# Patient Record
Sex: Male | Born: 1990 | Race: White | Hispanic: No | Marital: Single | Smoking: Never smoker
Health system: Southern US, Community
[De-identification: ages and names within clinical notes are randomized; demographics above are authoritative.]

## PROBLEM LIST (undated history)

## (undated) DIAGNOSIS — R112 Nausea with vomiting, unspecified: Secondary | ICD-10-CM

## (undated) DIAGNOSIS — K219 Gastro-esophageal reflux disease without esophagitis: Secondary | ICD-10-CM

## (undated) DIAGNOSIS — F419 Anxiety disorder, unspecified: Secondary | ICD-10-CM

## (undated) DIAGNOSIS — F1290 Cannabis use, unspecified, uncomplicated: Secondary | ICD-10-CM

## (undated) DIAGNOSIS — F12988 Cannabis use, unspecified with other cannabis-induced disorder: Secondary | ICD-10-CM

## (undated) HISTORY — DX: Gastro-esophageal reflux disease without esophagitis: K21.9

## (undated) HISTORY — DX: Anxiety disorder, unspecified: F41.9

---
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
## 2009-07-18 ENCOUNTER — Emergency Department (HOSPITAL_COMMUNITY): Admission: EM | Admit: 2009-07-18 | Discharge: 2009-07-18 | Payer: Self-pay | Admitting: Emergency Medicine

## 2014-10-30 ENCOUNTER — Telehealth: Payer: Self-pay | Admitting: Gastroenterology

## 2014-10-30 ENCOUNTER — Ambulatory Visit (INDEPENDENT_AMBULATORY_CARE_PROVIDER_SITE_OTHER): Payer: BC Managed Care – PPO | Admitting: Gastroenterology

## 2014-10-30 ENCOUNTER — Encounter: Payer: Self-pay | Admitting: Gastroenterology

## 2014-10-30 ENCOUNTER — Other Ambulatory Visit (INDEPENDENT_AMBULATORY_CARE_PROVIDER_SITE_OTHER): Payer: BC Managed Care – PPO

## 2014-10-30 VITALS — BP 110/70 | HR 72 | Ht 74.0 in | Wt 196.0 lb

## 2014-10-30 DIAGNOSIS — R634 Abnormal weight loss: Secondary | ICD-10-CM

## 2014-10-30 DIAGNOSIS — R1110 Vomiting, unspecified: Secondary | ICD-10-CM

## 2014-10-30 DIAGNOSIS — R112 Nausea with vomiting, unspecified: Secondary | ICD-10-CM | POA: Insufficient documentation

## 2014-10-30 LAB — COMPREHENSIVE METABOLIC PANEL
ALT: 27 U/L (ref 0–53)
AST: 28 U/L (ref 0–37)
Albumin: 4.3 g/dL (ref 3.5–5.2)
Alkaline Phosphatase: 83 U/L (ref 39–117)
BUN: 12 mg/dL (ref 6–23)
CO2: 30 mEq/L (ref 19–32)
Calcium: 9.5 mg/dL (ref 8.4–10.5)
Chloride: 101 mEq/L (ref 96–112)
Creatinine, Ser: 1.1 mg/dL (ref 0.4–1.5)
GFR: 82.98 mL/min (ref >60.00)
Glucose, Bld: 92 mg/dL (ref 70–99)
Potassium: 4.5 mEq/L (ref 3.5–5.1)
Sodium: 138 mEq/L (ref 135–145)
Total Bilirubin: 0.8 mg/dL (ref 0.2–1.2)
Total Protein: 7.9 g/dL (ref 6.0–8.3)

## 2014-10-30 LAB — CBC WITH DIFFERENTIAL/PLATELET
BASOS PCT: 0.3 % (ref 0.0–3.0)
Basophils Absolute: 0 10*3/uL (ref 0.0–0.1)
Eosinophils Absolute: 0 10*3/uL (ref 0.0–0.7)
Eosinophils Relative: 0.5 % (ref 0.0–5.0)
HEMATOCRIT: 48.3 % (ref 39.0–52.0)
Hemoglobin: 16 g/dL (ref 13.0–17.0)
LYMPHS ABS: 1.8 10*3/uL (ref 0.7–4.0)
LYMPHS PCT: 23.1 % (ref 12.0–46.0)
MCHC: 33.1 g/dL (ref 30.0–36.0)
MCV: 91 fl (ref 78.0–100.0)
Monocytes Absolute: 0.9 10*3/uL (ref 0.1–1.0)
Monocytes Relative: 11.8 % (ref 3.0–12.0)
Neutro Abs: 5 10*3/uL (ref 1.4–7.7)
Neutrophils Relative %: 64.3 % (ref 43.0–77.0)
Platelets: 234 10*3/uL (ref 150.0–400.0)
RBC: 5.31 Mil/uL (ref 4.22–5.81)
RDW: 12.7 % (ref 11.5–15.5)
WBC: 7.7 10*3/uL (ref 4.0–10.5)

## 2014-10-30 LAB — LIPASE: Lipase: 26 U/L (ref 11.0–59.0)

## 2014-10-30 MED ORDER — PANTOPRAZOLE SODIUM 40 MG PO TBEC: 40.0000 mg | DELAYED_RELEASE_TABLET | Freq: Every day | ORAL | Status: DC

## 2014-10-30 NOTE — Telephone Encounter (Signed)
Patient's mother reports daily nausea and vomiting.  He is scheduled for today at 3:00 with Doug SouJessica Zehr, PA

## 2014-10-30 NOTE — Progress Notes (Signed)
     10/30/2014 Peter SessionsMark G Suarez [id number] Jul 10, 1991   HISTORY OF PRESENT ILLNESS:  This is a [age] year old male who presents to our office today as a new patient for complaints of vomiting and weight loss.  He says that a couple of years ago he went through a period where he was vomiting every day, but it was thought to be related to his stressful job.  He did get a new job and the symptoms subsided; was still having some vomiting maybe once a month, however.  Now, over the past month the vomiting has returned.  He says that every morning he is waking up vomiting.  Vomits liquid only.  He has much less stress at his current job.  Vomiting only occurs in the early morning and not throughout the day, so not related to eating.  He does not that he's had some increased heartburn/reflux recently as well.  Some diffuse upper abdominal pain/discomfort that feels like "knots" in his stomach". Appetite has been less.  Lost 8-10 pounds over the past month.  No bowel issues.  Has not had any evaluation or treatment at this point.     History reviewed. No pertinent past medical history. History reviewed. No pertinent past surgical history.  reports that he has never smoked. He does not have any smokeless tobacco history on file. He reports that he does not use illicit drugs. His alcohol history is not on file. family history includes High blood pressure in his maternal grandmother. No Known Allergies    Outpatient Encounter Prescriptions as of 10/30/2014  Medication Sig  . pantoprazole (PROTONIX) 40 MG tablet Take 1 tablet (40 mg total) by mouth daily.     REVIEW OF SYSTEMS  : All other systems reviewed and negative except where noted in the History of Present Illness.   PHYSICAL EXAM: BP 110/70 mmHg  Pulse 72  Ht 6\' 2"  (1.88 m)  Wt 196 lb (88.905 kg)  BMI 25.15 kg/m2 General: Well developed white male in no acute distress Head: Normocephalic and atraumatic Eyes:  Sclerae anicteric, conjunctiva  pink. Ears: Normal auditory acuity Lungs: Clear throughout to auscultation Heart: Regular rate and rhythm Abdomen: Soft, non-distended.  Normal bowel sounds.  Mild upper abdominal TTP without R/R/G. Musculoskeletal: Symmetrical with no gross deformities  Skin: No lesions on visible extremities Extremities: No edema  Neurological: Alert oriented x 4, grossly non-focal Psychological:  Alert and cooperative. Normal mood and affect  ASSESSMENT AND PLAN: -23 year old male with persistent daily vomiting and now weight loss of 8-10 pounds over the past month:  ? GERD related vs gallbladder issues, etc.  Will schedule for EGD with Dr. Leone PayorGessner.  The risks, benefits, and alternatives were discussed with the patient and he consents to proceed.  Will start pantoprazole 40 mg daily as well.  If EGD normal/negative and no improvement on PPI then could consider gallbladder evaluation.

## 2014-10-30 NOTE — Patient Instructions (Signed)
You have been scheduled for an endoscopy. Please follow written instructions given to you at your visit today. If you use inhalers (even only as needed), please bring them with you on the day of your procedure. Your physician has requested that you go to www.startemmi.com and enter the access code given to you at your visit today. This web site gives a general overview about your procedure. However, you should still follow specific instructions given to you by our office regarding your preparation for the procedure.  Your physician has requested that you go to the basement for the following lab work before leaving today: CBC CMP TSH Lipase   We have sent the following medications to your pharmacy for you to pick up at your convenience: Protonix

## 2014-10-31 ENCOUNTER — Encounter: Payer: Self-pay | Admitting: Internal Medicine

## 2014-10-31 ENCOUNTER — Ambulatory Visit (AMBULATORY_SURGERY_CENTER): Payer: BC Managed Care – PPO | Admitting: Internal Medicine

## 2014-10-31 VITALS — BP 110/72 | HR 61 | Temp 97.9°F | Resp 49 | Ht 74.0 in | Wt 196.0 lb

## 2014-10-31 DIAGNOSIS — K2990 Gastroduodenitis, unspecified, without bleeding: Secondary | ICD-10-CM

## 2014-10-31 DIAGNOSIS — R1110 Vomiting, unspecified: Secondary | ICD-10-CM

## 2014-10-31 DIAGNOSIS — K208 Other esophagitis: Secondary | ICD-10-CM

## 2014-10-31 DIAGNOSIS — R1115 Cyclical vomiting syndrome unrelated to migraine: Secondary | ICD-10-CM

## 2014-10-31 DIAGNOSIS — K2970 Gastritis, unspecified, without bleeding: Secondary | ICD-10-CM

## 2014-10-31 DIAGNOSIS — K2950 Unspecified chronic gastritis without bleeding: Secondary | ICD-10-CM

## 2014-10-31 DIAGNOSIS — K209 Esophagitis, unspecified without bleeding: Secondary | ICD-10-CM

## 2014-10-31 HISTORY — PX: ESOPHAGOGASTRODUODENOSCOPY: SHX1529

## 2014-10-31 LAB — TSH: TSH: 1.17 u[IU]/mL (ref 0.35–4.50)

## 2014-10-31 MED ORDER — OMEPRAZOLE-SODIUM BICARBONATE 40-1100 MG PO CAPS: 1.0000 | ORAL_CAPSULE | Freq: Every day | ORAL | Status: DC

## 2014-10-31 MED ORDER — SODIUM CHLORIDE 0.9 % IV SOLN: 500.0000 mL | INTRAVENOUS | Status: DC

## 2014-10-31 NOTE — Patient Instructions (Addendum)
There as esophagitis and gastritis - inflammation of esophagus and stomach present. I took biopsies to understand the cause. Do not know if these are causing the vomiting or a result.  I am prescribing Zegerid to take at bedtime - it blocks acid production and can help.  Will call with biopsy results and plans.  I appreciate the opportunity to care for you. Iva Booparl E. Zafirah Vanzee, MD, [facility]   Discharge instructions given with verbal understanding. Biopsies taken. Resume previous medications. YOU HAD AN ENDOSCOPIC PROCEDURE TODAY AT THE [facility]: Refer to the procedure report that was given to you for any specific questions about what was found during the examination.  If the procedure report does not answer your questions, please call your gastroenterologist to clarify.  If you requested that your care partner not be given the details of your procedure findings, then the procedure report has been included in a sealed envelope for you to review at your convenience later.  YOU SHOULD EXPECT: Some feelings of bloating in the abdomen. Passage of more gas than usual.  Walking can help get rid of the air that was put into your GI tract during the procedure and reduce the bloating. If you had a lower endoscopy (such as a colonoscopy or flexible sigmoidoscopy) you may notice spotting of blood in your stool or on the toilet paper. If you underwent a bowel prep for your procedure, then you may not have a normal bowel movement for a few days.  DIET: Your first meal following the procedure should be a light meal and then it is ok to progress to your normal diet.  A half-sandwich or bowl of soup is an example of a good first meal.  Heavy or fried foods are harder to digest and may make you feel nauseous or bloated.  Likewise meals heavy in dairy and vegetables can cause extra gas to form and this can also increase the bloating.  Drink plenty of fluids but you should avoid alcoholic beverages for  24 hours.  ACTIVITY: Your care partner should take you home directly after the procedure.  You should plan to take it easy, moving slowly for the rest of the day.  You can resume normal activity the day after the procedure however you should NOT DRIVE or use heavy machinery for 24 hours (because of the sedation medicines used during the test).    SYMPTOMS TO REPORT IMMEDIATELY: A gastroenterologist can be reached at any hour.  During normal business hours, 8:30 AM to 5:00 PM Monday through Friday, call [phone].  After hours and on weekends, please call the GI answering service at [phone] who will take a message and have the physician on call contact you.   Following upper endoscopy (EGD)  Vomiting of blood or coffee ground material  New chest pain or pain under the shoulder blades  Painful or persistently difficult swallowing  New shortness of breath  Fever of 100F or higher  Black, tarry-looking stools  FOLLOW UP: If any biopsies were taken you will be contacted by phone or by letter within the next 1-3 weeks.  Call your gastroenterologist if you have not heard about the biopsies in 3 weeks.  Our staff will call the home number listed on your records the next business day following your procedure to check on you and address any questions or concerns that you may have at that time regarding the information given to you following your procedure. This is a Research officer, political partycourtesy  call and so if there is no answer at the home number and we have not heard from you through the emergency physician on call, we will assume that you have returned to your regular daily activities without incident.  SIGNATURES/CONFIDENTIALITY: You and/or your care partner have signed paperwork which will be entered into your electronic medical record.  These signatures attest to the fact that that the information above on your After Visit Summary has been reviewed and is understood.  Full responsibility of the  confidentiality of this discharge information lies with you and/or your care-partner.

## 2014-10-31 NOTE — Op Note (Signed)
[facility] [address] [place], [id number]   ENDOSCOPY PROCEDURE REPORT  PATIENT: Peter Suarez, Peter Suarez  MR#: [id number] BIRTHDATE: 04-May-1991 , 23  yrs. old GENDER: male ENDOSCOPIST: Iva Booparl E Gessner, MD, [facility] PROCEDURE DATE:  10/31/2014 PROCEDURE:  EGD w/ biopsy ASA CLASS:     Class I INDICATIONS:  vomiting. MEDICATIONS: Propofol 280 mg IV and Monitored anesthesia care TOPICAL ANESTHETIC: none  DESCRIPTION OF PROCEDURE: After the risks benefits and alternatives of the procedure were thoroughly explained, informed consent was obtained.  The LB [id number] endoscope was introduced through the mouth and advanced to the second portion of the duodenum , Without limitations.  The instrument was slowly withdrawn as the mucosa was fully examined.    1) several superficial short erosions in distal esophagus - biopsies taken of erosive esophagitis 2) Mottled and erythematous antral and body mucosa - biopsies taken - looks like gastritis 3) Otherwise normal exam.  Retroflexed views revealed as previously described.     The scope was then withdrawn from the patient and the procedure completed.  COMPLICATIONS: There were no immediate complications.  ENDOSCOPIC IMPRESSION: 1) several superficial short erosions in distal esophagus - biopsies taken of erosive esophagitis 2) Mottled and erythematous antral and body mucosa - biopsies taken - looks like gastritis 3) Otherwise normal exam  RECOMMENDATIONS: 1) Zegerid 40 mg qhs 2) Call with biopsy results    eSigned:  Iva Booparl E Gessner, MD, [facility] 10/31/2014 8:24 AM    CC: The Patient

## 2014-10-31 NOTE — Progress Notes (Signed)
Report to PACU, RN, vss, BBS= Clear.  

## 2014-11-01 ENCOUNTER — Telehealth: Payer: Self-pay | Admitting: *Deleted

## 2014-11-01 NOTE — Telephone Encounter (Signed)
No answer, left message to call if questions or concerns. 

## 2014-11-04 NOTE — Progress Notes (Signed)
Agree with Ms. Zehr's management.  Icel Castles E. Townes Fuhs, MD, FACG  

## 2014-11-09 NOTE — Progress Notes (Signed)
Quick Note:  Let him know biopsies show inflammation - think GERD may be problem stay on Zegerid (ask if helping) See me in Jan  [facility] no letter or recall  ______

## 2014-12-10 ENCOUNTER — Telehealth: Payer: Self-pay | Admitting: Internal Medicine

## 2014-12-10 DIAGNOSIS — R112 Nausea with vomiting, unspecified: Secondary | ICD-10-CM

## 2014-12-10 NOTE — Telephone Encounter (Signed)
Schedule abdominal US  Rx promethazine 25 mg po every 6 hrs as needed # 30 no refill  Double check that he does not have headaches, visual sxs

## 2014-12-10 NOTE — Telephone Encounter (Signed)
Patient is having daily vomiting again.  Started again last week.  He had to leave work today.  Per procedure note may need additional testing for gallbladder testing.  He does not want to see an APP and next opening with you is 01/12/15

## 2014-12-11 MED ORDER — PROMETHAZINE HCL 25 MG PO TABS: 25.0000 mg | ORAL_TABLET | Freq: Four times a day (QID) | ORAL | Status: DC | PRN

## 2014-12-11 NOTE — Telephone Encounter (Signed)
Patient is scheduled for 12/13/14 9:00.  Patient to be NPO after midnight  He is notified to pick up rx for phenergan  No daily headaches or visual problems

## 2014-12-13 ENCOUNTER — Other Ambulatory Visit: Payer: Self-pay

## 2014-12-13 ENCOUNTER — Ambulatory Visit (HOSPITAL_COMMUNITY)
Admission: RE | Admit: 2014-12-13 | Discharge: 2014-12-13 | Disposition: A | Discharge status: Home / Self Care | Payer: BC Managed Care – PPO | Source: Ambulatory Visit | Attending: Internal Medicine | Admitting: Internal Medicine

## 2014-12-13 ENCOUNTER — Emergency Department (HOSPITAL_COMMUNITY)
Admission: EM | Admit: 2014-12-13 | Discharge: 2014-12-13 | Disposition: A | Discharge status: Home / Self Care | Payer: BC Managed Care – PPO | Attending: Emergency Medicine | Admitting: Emergency Medicine

## 2014-12-13 ENCOUNTER — Encounter (HOSPITAL_COMMUNITY): Payer: Self-pay

## 2014-12-13 DIAGNOSIS — F419 Anxiety disorder, unspecified: Secondary | ICD-10-CM

## 2014-12-13 DIAGNOSIS — F439 Reaction to severe stress, unspecified: Secondary | ICD-10-CM | POA: Insufficient documentation

## 2014-12-13 DIAGNOSIS — R112 Nausea with vomiting, unspecified: Secondary | ICD-10-CM | POA: Diagnosis not present

## 2014-12-13 DIAGNOSIS — K219 Gastro-esophageal reflux disease without esophagitis: Secondary | ICD-10-CM | POA: Insufficient documentation

## 2014-12-13 DIAGNOSIS — K209 Esophagitis, unspecified without bleeding: Secondary | ICD-10-CM

## 2014-12-13 DIAGNOSIS — Z79899 Other long term (current) drug therapy: Secondary | ICD-10-CM | POA: Insufficient documentation

## 2014-12-13 DIAGNOSIS — Z8659 Personal history of other mental and behavioral disorders: Secondary | ICD-10-CM | POA: Diagnosis not present

## 2014-12-13 DIAGNOSIS — R42 Dizziness and giddiness: Secondary | ICD-10-CM | POA: Diagnosis present

## 2014-12-13 LAB — CBC WITH DIFFERENTIAL/PLATELET
BASOS PCT: 0 % (ref 0–1)
Basophils Absolute: 0 10*3/uL (ref 0.0–0.1)
EOS PCT: 0 % (ref 0–5)
Eosinophils Absolute: 0 10*3/uL (ref 0.0–0.7)
HEMATOCRIT: 48.6 % (ref 39.0–52.0)
Hemoglobin: 17 g/dL (ref 13.0–17.0)
Lymphocytes Relative: 10 % — ABNORMAL LOW (ref 12–46)
Lymphs Abs: 1 10*3/uL (ref 0.7–4.0)
MCH: 30.5 pg (ref 26.0–34.0)
MCHC: 35 g/dL (ref 30.0–36.0)
MCV: 87.3 fL (ref 78.0–100.0)
MONO ABS: 0.9 10*3/uL (ref 0.1–1.0)
Monocytes Relative: 8 % (ref 3–12)
NEUTROS ABS: 8.6 10*3/uL — AB (ref 1.7–7.7)
Neutrophils Relative %: 82 % — ABNORMAL HIGH (ref 43–77)
Platelets: 247 10*3/uL (ref 150–400)
RBC: 5.57 MIL/uL (ref 4.22–5.81)
RDW: 12 % (ref 11.5–15.5)
WBC: 10.5 10*3/uL (ref 4.0–10.5)

## 2014-12-13 LAB — URINALYSIS, ROUTINE W REFLEX MICROSCOPIC
Glucose, UA: NEGATIVE mg/dL
Hgb urine dipstick: NEGATIVE
Leukocytes, UA: NEGATIVE
NITRITE: NEGATIVE
Protein, ur: 30 mg/dL — AB
Specific Gravity, Urine: 1.031 — ABNORMAL HIGH (ref 1.005–1.030)
UROBILINOGEN UA: 1 mg/dL (ref 0.0–1.0)
pH: 8.5 — ABNORMAL HIGH (ref 5.0–8.0)

## 2014-12-13 LAB — COMPREHENSIVE METABOLIC PANEL
ALT: 19 U/L (ref 0–53)
AST: 19 U/L (ref 0–37)
Albumin: 5.1 g/dL (ref 3.5–5.2)
Alkaline Phosphatase: 86 U/L (ref 39–117)
Anion gap: 19 — ABNORMAL HIGH (ref 5–15)
BUN: 15 mg/dL (ref 6–23)
CO2: 22 meq/L (ref 19–32)
Calcium: 10.4 mg/dL (ref 8.4–10.5)
Chloride: 98 mEq/L (ref 96–112)
Creatinine, Ser: 1.11 mg/dL (ref 0.50–1.35)
GFR calc Af Amer: >90 mL/min (ref >90)
Glucose, Bld: 106 mg/dL — ABNORMAL HIGH (ref 70–99)
POTASSIUM: 3.6 meq/L — AB (ref 3.7–5.3)
SODIUM: 139 meq/L (ref 137–147)
Total Bilirubin: 0.9 mg/dL (ref 0.3–1.2)
Total Protein: 8.2 g/dL (ref 6.0–8.3)

## 2014-12-13 LAB — URINE MICROSCOPIC-ADD ON

## 2014-12-13 LAB — LIPASE, BLOOD: Lipase: 43 U/L (ref 11–59)

## 2014-12-13 MED ORDER — SODIUM CHLORIDE 0.9 % IV BOLUS (SEPSIS)
1000.0000 mL | Freq: Once | INTRAVENOUS | Status: AC
  Administered 2014-12-13: 1000 mL via INTRAVENOUS

## 2014-12-13 MED ORDER — OMEPRAZOLE-SODIUM BICARBONATE 40-1100 MG PO CAPS: 1.0000 | ORAL_CAPSULE | Freq: Two times a day (BID) | ORAL | Status: DC

## 2014-12-13 MED ORDER — PROMETHAZINE HCL 25 MG/ML IJ SOLN
25.0000 mg | Freq: Once | INTRAMUSCULAR | Status: AC
  Administered 2014-12-13: 25 mg via INTRAVENOUS
  Filled 2014-12-13: qty 1

## 2014-12-13 MED ORDER — ONDANSETRON 8 MG PO TBDP: 8.0000 mg | ORAL_TABLET | Freq: Three times a day (TID) | ORAL | Status: DC | PRN

## 2014-12-13 NOTE — ED Provider Notes (Signed)
CSN: [id number]     Arrival date & time 12/13/14  0932 History   First MD Initiated Contact with Patient 12/13/14 [phone]     Chief Complaint  Patient presents with  . Dizziness  . Emesis  . Abdominal Pain    HPI Mr. Peter Suarez is a 23 year old male who presents today with nausea and non-bloody vomiting x 2 days. He sees Dr. Leone PayorGessner and has had this problem before. He was started on some medication he cannot remember and feels it is related to stress at work. This week, he was given Phenergan which has helped his symptoms. He has been NPO since midnight for an abdominal US study done. He denies eating anything different, diarrhea, fever, sick contacts.    Past Medical History  Diagnosis Date  . Anxiety   . GERD (gastroesophageal reflux disease)    History reviewed. No pertinent past surgical history. Family History  Problem Relation Age of Onset  . High blood pressure Maternal Grandmother    History  Substance Use Topics  . Smoking status: Never Smoker   . Smokeless tobacco: Not on file  . Alcohol Use: Not on file     Comment: Rarely    Review of Systems  Constitutional: Negative for fever.  Gastrointestinal: Positive for nausea and vomiting. Negative for abdominal pain and diarrhea.  Genitourinary: Negative for dysuria.      Allergies  Review of patient's allergies indicates no known allergies.  Home Medications   Prior to Admission medications   Medication Sig Start Date End Date Taking? Authorizing Provider  pantoprazole (PROTONIX) 40 MG tablet Take 1 tablet (40 mg total) by mouth daily. 10/30/14  Yes Jessica D. Zehr, PA-C  promethazine (PHENERGAN) 25 MG tablet Take 1 tablet (25 mg total) by mouth every 6 (six) hours as needed for nausea or vomiting. 12/11/14  Yes Iva Booparl E Gessner, MD  omeprazole-sodium bicarbonate (ZEGERID) 40-1100 MG per capsule Take 1 capsule by mouth 2 (two) times daily. 12/13/14   Heywood Ilesushil Cliffie Gingras, MD  ondansetron (ZOFRAN ODT) 8 MG disintegrating tablet  Take 1 tablet (8 mg total) by mouth every 8 (eight) hours as needed for nausea or vomiting. 12/13/14   Heywood Ilesushil Elex Mainwaring, MD   BP 119/72 mmHg  Pulse 63  Temp(Src) 99.4 F (37.4 C) (Oral)  Resp 12  SpO2 100% Physical Exam  Constitutional: He is oriented to person, place, and time. He appears well-developed and well-nourished. He appears distressed.  HENT:  Head: Normocephalic and atraumatic.  Mouth/Throat: No oropharyngeal exudate.  Dry mucous membranes.  Eyes: Conjunctivae are normal. Pupils are equal, round, and reactive to light.  Cardiovascular: Normal rate and regular rhythm.  Exam reveals no gallop and no friction rub.   No murmur heard. Pulmonary/Chest: Effort normal and breath sounds normal. No respiratory distress. He has no wheezes. He has no rales.  Abdominal: Soft. He exhibits no distension. There is no tenderness. There is no rebound.  Hypoactive bowel sounds. No tenderness to palpation though reports some discomfort.  Neurological: He is alert and oriented to person, place, and time. No cranial nerve deficit.  Skin: He is not diaphoretic.    ED Course  Procedures (including critical care time) Labs Review Labs Reviewed  CBC WITH DIFFERENTIAL - Abnormal; Notable for the following:    Neutrophils Relative % 82 (*)    Neutro Abs 8.6 (*)    Lymphocytes Relative 10 (*)    All other components within normal limits  URINALYSIS, ROUTINE W REFLEX MICROSCOPIC - Abnormal;  Notable for the following:    Color, Urine AMBER (*)    APPearance TURBID (*)    Specific Gravity, Urine 1.031 (*)    pH 8.5 (*)    Bilirubin Urine SMALL (*)    Ketones, ur >80 (*)    Protein, ur 30 (*)    All other components within normal limits  COMPREHENSIVE METABOLIC PANEL - Abnormal; Notable for the following:    Potassium 3.6 (*)    Glucose, Bld 106 (*)    Anion gap 19 (*)    All other components within normal limits  URINE MICROSCOPIC-ADD ON - Abnormal; Notable for the following:    Bacteria, UA  MANY (*)    All other components within normal limits  LIPASE, BLOOD    Imaging Review Koreas Abdomen Complete  12/13/2014   CLINICAL DATA:  Intractable vomiting with nausea.  EXAM: ULTRASOUND ABDOMEN COMPLETE  COMPARISON:  None.  FINDINGS: Gallbladder: No gallstones or wall thickening visualized. No sonographic Murphy sign noted.  Common bile duct: Diameter: 3 mm  Liver: No focal lesion identified. Within normal limits in parenchymal echogenicity.  IVC: No abnormality visualized.  Pancreas: Visualized portion unremarkable.  Spleen: Size and appearance within normal limits.  Right Kidney: Length: 9.9 cm. Echogenicity within normal limits. No mass or hydronephrosis visualized.  Left Kidney: Length: 9.9 cm. Echogenicity within normal limits. No mass or hydronephrosis visualized.  Abdominal aorta: No aneurysm visualized.  Other findings: None.  IMPRESSION: Negative. No evidence of gallstones or other sonographic abnormality.   Electronically Signed   By: Myles RosenthalJohn  Stahl M.D.   On: 12/13/2014 10:40     EKG Interpretation None      MDM   Final diagnoses:  Non-intractable vomiting with nausea, vomiting of unspecified type    Symptoms concerning for hepatobiliary disease though reassuring that he is without. Ab US pending.  11:00AM: Ab US is unremarkable. Labs remarkable for elevated anion gap, likely 2/2 persistent vomiting. LFTs otherwise normal. Plan to treat symptomatically.  139PM: UA with ketones 80, turbid. Plan to give more IV fluids for total 3L NS. Consulted GI who recommended increase PPI dosing to BID. Will also give Zofran instead of Phenergan given that the latter is more sedating for him.  257PM: Symptomatically improved and stable for discharge. Prescribed Zofran & omeprazole.   Heywood Ilesushil Dung Salinger, MD 12/13/14 1458  Juliet RudeNathan R. Rubin PayorPickering, MD 12/13/14 [phone]

## 2014-12-13 NOTE — ED Notes (Addendum)
Pt c/o sudden dizziness and tingling in extremities starting this morning, chronic generalized abdominal pain, and chronic emesis.  Pain score 3/10.  Sts emesis has increased x 1 week.  Pt reports having multiple GI issues and is followed by a GI MD.  Hx of GERD.  [facility] he began having symptoms.  Pt has had no medication, food, or drinks, since midnight.

## 2014-12-13 NOTE — Progress Notes (Signed)
Quick Note:  US is negative  I see he was in ED today also and told them he thinks stress is causing the nausea and vomiting That seems likely given results of labs and studies to date. I would not do more GI w/u but suggest he see a PCP - [facility] and be [facility] and they mnay be able to start something to treat stress and anxiety that may be cause. ______

## 2014-12-13 NOTE — ED Notes (Signed)
MD at bedside. EDP PICKING PRESENT

## 2015-01-09 ENCOUNTER — Ambulatory Visit (INDEPENDENT_AMBULATORY_CARE_PROVIDER_SITE_OTHER): Payer: BLUE CROSS/BLUE SHIELD | Admitting: Internal Medicine

## 2015-01-09 ENCOUNTER — Telehealth: Payer: Self-pay | Admitting: Internal Medicine

## 2015-01-09 ENCOUNTER — Telehealth: Payer: Self-pay | Admitting: *Deleted

## 2015-01-09 ENCOUNTER — Encounter (HOSPITAL_COMMUNITY): Payer: Self-pay | Admitting: Emergency Medicine

## 2015-01-09 ENCOUNTER — Emergency Department (HOSPITAL_COMMUNITY)
Admission: EM | Admit: 2015-01-09 | Discharge: 2015-01-09 | Disposition: A | Discharge status: Home / Self Care | Payer: BLUE CROSS/BLUE SHIELD | Attending: Emergency Medicine | Admitting: Emergency Medicine

## 2015-01-09 ENCOUNTER — Encounter: Payer: Self-pay | Admitting: Internal Medicine

## 2015-01-09 VITALS — BP 118/62 | HR 64 | Ht 74.0 in | Wt 175.0 lb

## 2015-01-09 DIAGNOSIS — Z79899 Other long term (current) drug therapy: Secondary | ICD-10-CM | POA: Insufficient documentation

## 2015-01-09 DIAGNOSIS — K219 Gastro-esophageal reflux disease without esophagitis: Secondary | ICD-10-CM | POA: Insufficient documentation

## 2015-01-09 DIAGNOSIS — R112 Nausea with vomiting, unspecified: Secondary | ICD-10-CM | POA: Diagnosis not present

## 2015-01-09 DIAGNOSIS — Z8659 Personal history of other mental and behavioral disorders: Secondary | ICD-10-CM | POA: Insufficient documentation

## 2015-01-09 DIAGNOSIS — R1110 Vomiting, unspecified: Secondary | ICD-10-CM

## 2015-01-09 HISTORY — DX: Nausea with vomiting, unspecified: R11.2

## 2015-01-09 LAB — COMPREHENSIVE METABOLIC PANEL
ALK PHOS: 77 U/L (ref 39–117)
ALT: 27 U/L (ref 0–53)
ANION GAP: 14 (ref 5–15)
AST: 25 U/L (ref 0–37)
Albumin: 5.5 g/dL — ABNORMAL HIGH (ref 3.5–5.2)
BUN: 20 mg/dL (ref 6–23)
CALCIUM: 10.1 mg/dL (ref 8.4–10.5)
CO2: 21 mmol/L (ref 19–32)
CREATININE: 1.09 mg/dL (ref 0.50–1.35)
Chloride: 104 mEq/L (ref 96–112)
GFR calc non Af Amer: >90 mL/min (ref >90)
Glucose, Bld: 143 mg/dL — ABNORMAL HIGH (ref 70–99)
POTASSIUM: 3.5 mmol/L (ref 3.5–5.1)
Sodium: 139 mmol/L (ref 135–145)
TOTAL PROTEIN: 8.5 g/dL — AB (ref 6.0–8.3)
Total Bilirubin: 1.4 mg/dL — ABNORMAL HIGH (ref 0.3–1.2)

## 2015-01-09 LAB — LIPASE, BLOOD: LIPASE: 51 U/L (ref 11–59)

## 2015-01-09 LAB — URINALYSIS, ROUTINE W REFLEX MICROSCOPIC
Glucose, UA: NEGATIVE mg/dL
HGB URINE DIPSTICK: NEGATIVE
Ketones, ur: >80 mg/dL — AB
NITRITE: NEGATIVE
PH: 6.5 (ref 5.0–8.0)
Protein, ur: 30 mg/dL — AB
SPECIFIC GRAVITY, URINE: 1.04 — AB (ref 1.005–1.030)
Urobilinogen, UA: 1 mg/dL (ref 0.0–1.0)

## 2015-01-09 LAB — CBC WITH DIFFERENTIAL/PLATELET
BASOS PCT: 0 % (ref 0–1)
Basophils Absolute: 0 10*3/uL (ref 0.0–0.1)
EOS ABS: 0 10*3/uL (ref 0.0–0.7)
EOS PCT: 0 % (ref 0–5)
HCT: 46.2 % (ref 39.0–52.0)
HEMOGLOBIN: 16.1 g/dL (ref 13.0–17.0)
Lymphocytes Relative: 7 % — ABNORMAL LOW (ref 12–46)
Lymphs Abs: 0.8 10*3/uL (ref 0.7–4.0)
MCH: 30.3 pg (ref 26.0–34.0)
MCHC: 34.8 g/dL (ref 30.0–36.0)
MCV: 86.8 fL (ref 78.0–100.0)
Monocytes Absolute: 0.8 10*3/uL (ref 0.1–1.0)
Monocytes Relative: 7 % (ref 3–12)
NEUTROS PCT: 86 % — AB (ref 43–77)
Neutro Abs: 9.8 10*3/uL — ABNORMAL HIGH (ref 1.7–7.7)
Platelets: 266 10*3/uL (ref 150–400)
RBC: 5.32 MIL/uL (ref 4.22–5.81)
RDW: 12.2 % (ref 11.5–15.5)
WBC: 11.3 10*3/uL — ABNORMAL HIGH (ref 4.0–10.5)

## 2015-01-09 LAB — URINE MICROSCOPIC-ADD ON

## 2015-01-09 MED ORDER — ONDANSETRON 4 MG PO TBDP: 4.0000 mg | ORAL_TABLET | Freq: Three times a day (TID) | ORAL | Status: DC | PRN

## 2015-01-09 MED ORDER — SODIUM CHLORIDE 0.9 % IV BOLUS (SEPSIS)
500.0000 mL | Freq: Once | INTRAVENOUS | Status: AC
  Administered 2015-01-09: 500 mL via INTRAVENOUS

## 2015-01-09 MED ORDER — PROMETHAZINE HCL 25 MG RE SUPP: 25.0000 mg | Freq: Four times a day (QID) | RECTAL | Status: DC | PRN

## 2015-01-09 MED ORDER — SODIUM CHLORIDE 0.9 % IV SOLN: INTRAVENOUS | Status: DC

## 2015-01-09 MED ORDER — FAMOTIDINE IN NACL 20-0.9 MG/50ML-% IV SOLN
20.0000 mg | Freq: Once | INTRAVENOUS | Status: AC
  Administered 2015-01-09: 20 mg via INTRAVENOUS
  Filled 2015-01-09: qty 50

## 2015-01-09 MED ORDER — LORAZEPAM 1 MG PO TABS: ORAL_TABLET | ORAL | Status: DC

## 2015-01-09 MED ORDER — LORAZEPAM 2 MG/ML IJ SOLN
1.0000 mg | Freq: Once | INTRAMUSCULAR | Status: AC
  Administered 2015-01-09: 1 mg via INTRAVENOUS
  Filled 2015-01-09: qty 1

## 2015-01-09 MED ORDER — ONDANSETRON HCL 4 MG/2ML IJ SOLN
4.0000 mg | INTRAMUSCULAR | Status: DC | PRN
  Administered 2015-01-09: 4 mg via INTRAVENOUS
  Filled 2015-01-09: qty 2

## 2015-01-09 NOTE — Telephone Encounter (Signed)
See ER note, patient was given IV fluids in the ER, Ativan rx and zofran rx.  Vomiting has stopped and he is asleep.  Please advise the next step

## 2015-01-09 NOTE — Telephone Encounter (Signed)
They Rxed small amount of oral lorazepam  Need to try that for sxs and let me know how that works and I can write a month rx (will do that Fri so they can pick up)  Will need to get a PCP also - could try [facility] - can walk in there to be seen and establish if desired though can have to wait but might be quickest way to get "established" and get longer term care for suspected anxiety problems

## 2015-01-09 NOTE — Progress Notes (Signed)
   Subjective:    Patient ID: Peter Suarez, male    DOB: [birth year], 24 y.o.   MRN: [id number]  HPI Here with his mom. Actively vomiting small amounts of bilious fluid. Feels tingly. No pain or fever. Has not eaten in 2 days. Not sleeping well, slight rest with promethazine suppositories. Here with mom.  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 Quit job and it eventually resolved. Was Tx w/ Cymbalta.  No headaches or visual disturbances.  No stools in past few days.  Medications, allergies, past medical history, past surgical history, family history and social history are reviewed and updated in the EMR.  Review of Systems As above    Objective:   Physical Exam General:  Mildly agitated, vomiting, dry heaving Eyes:   anicteric Lungs:  clear Heart:  S1S2 no rubs, murmurs or gallops Abdomen:  soft and nontender, BS+ Ext:   no edema  Mood:  Not very interactive, asked mom to explain things    Data Reviewed:  12/17 ED bvisit and labs EGD US      Assessment & Plan:  Intractable vomiting with nausea, vomiting of unspecified type   I think there is underlying anxiety driving this given prior hx despite him saying he does not feel anxious. Esophagitis on EGD was from vomiting and not a primary issue is my bet. He seems dehydrated and not sure oral or suppository Rx will work - it hasn't.  Will go to ED for IVF, labs, Tx.  I think lorazepam may be a good choice once able to take some po's and then will likely need psychiatric eval, unless other issues turn up.  I can see him in f/u and coordinate referrals as he has no PCP at this point.  [facility]

## 2015-01-09 NOTE — Telephone Encounter (Signed)
[facility] - Client TELEPHONE ADVICE RECORD [facility] Patient Name: Peter Suarez Hyneman Gender: Male DOB: 1991/09/29 Age: 24 Y 6 M 2 D Return Phone Number: [phone] (Primary) Address: [address] [place]State[place]Zip: [place] [id number] Client [facility] - Client Client Site [facility] Night Contact Type Call Call Type Triage / Clinical Caller Name Isabelle CourseLydia Relationship To Patient Mother Return Phone Number [phone] (Primary) Chief Complaint Vomiting Initial Comment Caller states son has been vomiting since Monday morning. Dr Leone PayorGessner, not listed PreDisposition Call Doctor Nurse Assessment Nurse: Roma KayserForsythe, RN, Santina Evansatherine Date/Time (Eastern Time): 01/08/2015 8:18:43 PM Confirm and document reason for call. If symptomatic, describe symptoms. ---caller states he threw up at least 14 times yesterday and 7 today. no diarrhea, has not been able to eat anything. same thing happened three weeks ago was seen in er and given 3 liters of fluid and rx for phenergan and omperazole. but can not keep phenergan down. is there something else he could try? has appt tomorrow with pcp. no fever Has the patient traveled out of the country within the last 30 days? ---Not Applicable Does the patient require triage? ---Yes Related visit to physician within the last 2 weeks? ---Yes Does the PT have any chronic conditions? (i.e. diabetes, asthma, etc.) ---No Guidelines Guideline Title Affirmed Question Affirmed Notes Nurse Date/Time (Eastern Time) Vomiting [1] SEVERE vomiting (e.g., 6 or more times/ day) AND [2] present > 8 hours Ma HillockForsythe, RN, Catherine 01/08/2015 8:20:12 PM Disp. Time Lamount Cohen(Eastern Time) Disposition Final User 01/08/2015 8:31:47 PM Pharmacy Call Roma KayserForsythe, RN, Santina Evansatherine Reason: [facility] [phone] left vm 01/08/2015 8:34:12 PM Go to ED Now (or PCP triage) Yes Roma KayserForsythe, RN, Archie Endoatherine Caller Understands:  Yes PLEASE NOTE: All timestamps contained within this report are represented as [place] Standard Time. CONFIDENTIALTY NOTICE: This fax transmission is intended only for the addressee. It contains information that is legally privileged, confidential or otherwise protected from use or disclosure. If you are not the intended recipient, you are strictly prohibited from reviewing, disclosing, copying using or disseminating any of this information or taking any action in reliance on or regarding this information. If you have received this fax in error, please notify us immediately by telephone so that we can arrange for its return to us. Phone: [phone], Toll-Free: [phone], Fax: [phone] Page: 2 of 3 Call Id: [id number] Disagree/Comply: Disagree Disagree/Comply Reason: Wait and see Care Advice Given Per Guideline GO TO ED NOW (OR PCP TRIAGE): After Care Instructions Given Call Event Type User Date / Time Description Standing Orders Preparation Additional Instructions Route Frequency Duration Nurse Comments User Name Phenergan 25 mg 1 Suppository # 6 Per Rectum Every 4 Hours Roma KayserForsythe, RN, Santina Evansatherine Referrals GO TO FACILITY REFUSED

## 2015-01-09 NOTE — ED Provider Notes (Signed)
CSN: [id number]     Arrival date & time 01/09/15  0907 History   First MD Initiated Contact with Patient 01/09/15 [phone]     Chief Complaint  Patient presents with  . Nausea      HPI Pt was seen at 1005. Per pt, c/o gradual onset and persistence of multiple intermittent episodes of N/V that began 2 days ago. Pt has hx of chronic, recurrent N/V with unknown cause after previous extensive evaluation (GI MD feels there may be an anxiety component to his symptoms). Pt states he feels his symptoms today are consistent with his usual chronic symptom pattern. Pt states he has been taking PR phenergan without relief. Also states "zofran doesn't work either." Pt was evaluated by his GI Dr. Leone PayorGessner PTA, and then sent to the ED for IVF and symptomatic treatment.  Denies abd pain, no CP/SOB, no back pain, no fevers, no black or blood in stools or emesis.     Past Medical History  Diagnosis Date  . Anxiety   . GERD (gastroesophageal reflux disease)   . Nausea and vomiting     chronic, recurrent   Past Surgical History  Procedure Laterality Date  . Esophagogastroduodenoscopy  10/31/2014   Family History  Problem Relation Age of Onset  . High blood pressure Maternal Grandmother    History  Substance Use Topics  . Smoking status: Never Smoker   . Smokeless tobacco: Not on file  . Alcohol Use: No     Comment: Rarely    Review of Systems ROS: Statement: All systems negative except as marked or noted in the HPI; Constitutional: Negative for fever and chills. ; ; Eyes: Negative for eye pain, redness and discharge. ; ; ENMT: Negative for ear pain, hoarseness, nasal congestion, sinus pressure and sore throat. ; ; Cardiovascular: Negative for chest pain, palpitations, diaphoresis, dyspnea and peripheral edema. ; ; Respiratory: Negative for cough, wheezing and stridor. ; ; Gastrointestinal: +N/V. Negative for diarrhea, abdominal pain, blood in stool, hematemesis, jaundice and rectal bleeding. . ; ;  Genitourinary: Negative for dysuria, flank pain and hematuria. ; ; Musculoskeletal: Negative for back pain and neck pain. Negative for swelling and trauma.; ; Skin: Negative for pruritus, rash, abrasions, blisters, bruising and skin lesion.; ; Neuro: Negative for headache, lightheadedness and neck stiffness. Negative for weakness, altered level of consciousness , altered mental status, extremity weakness, paresthesias, involuntary movement, seizure and syncope.     Allergies  Review of patient's allergies indicates no known allergies.  Home Medications   Prior to Admission medications   Medication Sig Start Date End Date Taking? Authorizing Provider  omeprazole-sodium bicarbonate (ZEGERID) 40-1100 MG per capsule Take 1 capsule by mouth 2 (two) times daily. 12/13/14   Heywood Ilesushil Patel, MD  pantoprazole (PROTONIX) 40 MG tablet Take 1 tablet (40 mg total) by mouth daily. 10/30/14   Princella PellegriniJessica D. Zehr, PA-C  promethazine (PHENERGAN) 25 MG suppository Place 25 mg rectally every 6 (six) hours as needed for nausea or vomiting.    Historical Provider, MD  promethazine (PHENERGAN) 25 MG tablet Take 1 tablet (25 mg total) by mouth every 6 (six) hours as needed for nausea or vomiting. 12/11/14   Iva Booparl E Gessner, MD   BP 113/68 mmHg  Pulse 90  Temp(Src) 99 F (37.2 C) (Oral)  Resp 14  SpO2 94% Physical Exam  1010: Physical examination:  Nursing notes reviewed; Vital signs and O2 SAT reviewed;  Constitutional: Well developed, Well nourished, In no acute distress; Head:  Normocephalic,  atraumatic; Eyes: EOMI, PERRL, No scleral icterus; ENMT: Mouth and pharynx normal, Mucous membranes dry; Neck: Supple, Full range of motion, No lymphadenopathy; Cardiovascular: Regular rate and rhythm, No murmur, rub, or gallop; Respiratory: Breath sounds clear & equal bilaterally, No rales, rhonchi, wheezes.  Speaking full sentences with ease, Normal respiratory effort/excursion; Chest: Nontender, Movement normal; Abdomen: Soft,  +mild mid-epigastric tenderness to palp. No rebound or guarding. Nondistended, Normal bowel sounds; Genitourinary: No CVA tenderness; Extremities: Pulses normal, No tenderness, No edema, No calf edema or asymmetry.; Neuro: AA&Ox3, Major CN grossly intact.  Speech clear. No gross focal motor or sensory deficits in extremities.; Skin: Color normal, Warm, Dry.; Psych:  Affect flat, poor eye contact.    ED Course  Procedures     EKG Interpretation None      MDM  MDM Reviewed: previous chart, nursing note and vitals Reviewed previous: labs and ultrasound Interpretation: labs     Results for orders placed or performed during the hospital encounter of 01/09/15  CBC with Differential  Result Value Ref Range   WBC 11.3 (H) 4.0 - 10.5 K/uL   RBC 5.32 4.22 - 5.81 MIL/uL   Hemoglobin 16.1 13.0 - 17.0 g/dL   HCT 16.1 09.6 - 04.5 %   MCV 86.8 78.0 - 100.0 fL   MCH 30.3 26.0 - 34.0 pg   MCHC 34.8 30.0 - 36.0 g/dL   RDW 40.9 81.1 - 91.4 %   Platelets 266 150 - 400 K/uL   Neutrophils Relative % 86 (H) 43 - 77 %   Neutro Abs 9.8 (H) 1.7 - 7.7 K/uL   Lymphocytes Relative 7 (L) 12 - 46 %   Lymphs Abs 0.8 0.7 - 4.0 K/uL   Monocytes Relative 7 3 - 12 %   Monocytes Absolute 0.8 0.1 - 1.0 K/uL   Eosinophils Relative 0 0 - 5 %   Eosinophils Absolute 0.0 0.0 - 0.7 K/uL   Basophils Relative 0 0 - 1 %   Basophils Absolute 0.0 0.0 - 0.1 K/uL  Comprehensive metabolic panel  Result Value Ref Range   Sodium 139 135 - 145 mmol/L   Potassium 3.5 3.5 - 5.1 mmol/L   Chloride 104 96 - 112 mEq/L   CO2 21 19 - 32 mmol/L   Glucose, Bld 143 (H) 70 - 99 mg/dL   BUN 20 6 - 23 mg/dL   Creatinine, Ser 7.82 0.50 - 1.35 mg/dL   Calcium 95.6 8.4 - 21.3 mg/dL   Total Protein 8.5 (H) 6.0 - 8.3 g/dL   Albumin 5.5 (H) 3.5 - 5.2 g/dL   AST 25 0 - 37 U/L   ALT 27 0 - 53 U/L   Alkaline Phosphatase 77 39 - 117 U/L   Total Bilirubin 1.4 (H) 0.3 - 1.2 mg/dL   GFR calc non Af Amer >90 >90 mL/min   GFR calc Af Amer  >90 >90 mL/min   Anion gap 14 5 - 15  Lipase, blood  Result Value Ref Range   Lipase 51 11 - 59 U/L  Urinalysis, Routine w reflex microscopic  Result Value Ref Range   Color, Urine AMBER (A) YELLOW   APPearance CLOUDY (A) CLEAR   Specific Gravity, Urine 1.040 (H) 1.005 - 1.030   pH 6.5 5.0 - 8.0   Glucose, UA NEGATIVE NEGATIVE mg/dL   Hgb urine dipstick NEGATIVE NEGATIVE   Bilirubin Urine SMALL (A) NEGATIVE   Ketones, ur >80 (A) NEGATIVE mg/dL   Protein, ur 30 (A) NEGATIVE mg/dL  Urobilinogen, UA 1.0 0.0 - 1.0 mg/dL   Nitrite NEGATIVE NEGATIVE   Leukocytes, UA TRACE (A) NEGATIVE  Urine microscopic-add on  Result Value Ref Range   WBC, UA 0-2 <3 WBC/hpf   Urine-Other MUCOUS PRESENT     1000:  T/C from GI Dr. Leone Payor, case discussed, including:  HPI, pertinent PM/SHx, VS/PE, dx testing, ED course and treatment:  Pt is in his office now, he will be sending over for IVF/labs check; pt has had extensive evaluation for recurrent N/V episodes, likely due to anxiety/stress, pt will need psych f/u and restart psych meds; he requests to try ativan/rx same, and he will f/u pt.   1415:  Pt has tol PO well while in the ED without N/V.  No stooling while in the ED.  Abd remains benign, VSS. Feels better and wants to go home now. Pt requesting refills of zofran ODT and phenergan PR. Will also rx ativan, per conversation with GI Dr. Leone Payor. Family agreeable with this plan. Dx and testing d/w pt and family.  Questions answered.  Verb understanding, agreeable to d/c home with outpt f/u.       Samuel Jester, DO 01/12/15 [phone]

## 2015-01-09 NOTE — Patient Instructions (Signed)
[facility].   I appreciate the opportunity to care for you.

## 2015-01-09 NOTE — ED Notes (Signed)
Pt c/o n/v since Monday morning, denies pain.

## 2015-01-09 NOTE — Discharge Instructions (Signed)
°Emergency Department Resource Guide °1) Find a Doctor and Pay Out of Pocket °Although you won't have to find out who is covered by your insurance plan, it is a good idea to ask around and get recommendations. You will then need to call the office and see if the doctor you have chosen will accept you as a new patient and what types of options they offer for patients who are self-pay. Some doctors offer discounts or will set up payment plans for their patients who do not have insurance, but you will need to ask so you aren't surprised when you get to your appointment. ° °2) Contact Your Local Health Department °Not all health departments have doctors that can see patients for sick visits, but many do, so it is worth a call to see if yours does. If you don't know where your local health department is, you can check in your phone book. The CDC also has a tool to help you locate your state's health department, and many state websites also have listings of all of their local health departments. ° °3) Find a Walk-in Clinic °If your illness is not likely to be very severe or complicated, you may want to try a walk in clinic. These are popping up all over the country in pharmacies, drugstores, and shopping centers. They're usually staffed by nurse practitioners or physician assistants that have been trained to treat common illnesses and complaints. They're usually fairly quick and inexpensive. However, if you have serious medical issues or chronic medical problems, these are probably not your best option. ° °No Primary Care Doctor: °- Call Health Connect at  832-8000 - they can help you locate a primary care doctor that  accepts your insurance, provides certain services, etc. °- Physician Referral Service- 1-800-533-3463 ° °Chronic Pain Problems: °Organization         Address  Phone   Notes  °[facility]  (336) [phone] Patients need to be referred by their primary care doctor.  ° °Medication  Assistance: °Organization         Address  Phone   Notes  °[place] [facility] [address] °[place], [place] [address] (336) 641-8030 --Must be a resident of [place] °-- Must have NO insurance coverage whatsoever (no Medicaid/ Medicare, etc.) °-- The pt. MUST have a primary care doctor that directs their care regularly and follows them in the community °  °[facility]  (866) 331-1348   °[employer]  (888) 892-1162   ° °Agencies that provide inexpensive medical care: °Organization         Address  Phone   Notes  °[facility]  (336) 832-8035   °[facility]    (336) 832-7272   °[facility] [address] °[place], [place] [address] (336) 832-4777   °[facility] [address], °[place] (336) 271-4999   °[facility]    (336) 373-0678   °[facility]    (336) 272-1050   °[facility] ° [address], [place] Phone:  (336) 832-4444, Fax:  (336) 832-4440 Hours of Operation:  9 am - 6 pm, M-F.  Also accepts Medicaid/Medicare and self-pay.  °[facility] ° [address], [place] Phone: (336) 832-3150, Fax: (336) [phone]. Hours of Operation:  8:30 am - 5:30 pm, M-F.  Also accepts Medicaid and self-pay.  °[facility] [place] [address], [place] Phone: (336) 878-6027   °[facility] [address], [place], [place] (336)723-1848, Ext. 123 Mondays & Thursdays: 7-9 AM.  First 15 patients are seen on a first come, first serve basis. °  ° °Medicaid-accepting [place] Providers: ° °Organization         Address  Phone   Notes  °[facility] [address], [place] (336) 641-2100 Also accepts self-pay patients.  °[facility] [address], [place] ° (336) 856-9996   °[facility] [address], [place]  (336) 288-8857   °[facility] [address] [place] [address], [place] (336) 299-7000   °Veita Bland [address], [place]  ° (336) 373-1557 Only accepts [place] Access Medicaid patients after they have their name applied to their card.  ° °Self-Pay (no insurance) in [place]: ° °Organization         Address  Phone   Notes  °Sickle Cell Patients, [facility] [address], [place] (336) 832-1970   °[facility] [address], [place] (336) 832-4400   °[facility] ° [address], [place] (336) 992-4800   °[facility]/Dr. Osei-Bonsu ° [address] [place] [address], [place] or [address], [place] (336) [phone] Phone number for both [place] and [place] locations is the same.  °[facility] [address], [place] (336) 299-0000   °[facility] [address] [place] [address], [place] or [address] (336) 852-7530 °(336) 878-2260   °[facility] [address], [place] (336) 350-1642, phone; (336) 294-5005, fax Sees patients 1st and 3rd Saturday of every month.  Must not qualify for public or private insurance (i.e. Medicaid, Medicare, [place] Health Choice, Veterans' Benefits) • Household income should be no more than 200% of the poverty level •The clinic cannot treat you if you are pregnant or think you are pregnant • Sexually transmitted diseases are not treated at the clinic.  ° ° °Dental Care: °Organization         Address  Phone  Notes  °[place] [facility] [address], [place] (336) 641-6152 Accepts children up to age 21 who are enrolled in Medicaid or [place] Health Choice; pregnant women with a Medicaid card; and children who have applied for Medicaid or [place] Health Choice, but were declined, whose parents can pay a reduced fee at time of service.  °[place]  [facility] [place]  [address], [place] (336) 641-7733 Accepts children up to age 21 who are enrolled in Medicaid or [place] Health Choice; pregnant women with a Medicaid card; and children who have applied for Medicaid or [place] Health Choice, but were declined, whose parents can pay a reduced fee at time of service.  °[facility] ° [address], [place] (336) 641-4533 Patients are seen by appointment only. Walk-ins are not accepted. [facility] will see patients 18 years of age and older. °Monday - Tuesday (8am-5pm) °Most Wednesdays (8:30-5pm) °$30 per visit, cash only  °[facility] ° [address], [place] (336) 641-4533 Patients are seen by appointment only. Walk-ins are not accepted. [facility] will see patients 18 years of age and older. °One   Wednesday Evening (Monthly: Volunteer Based).  $30 per visit, cash only  °[facility]  (919) [phone] for adults; Children under age 4, call Graduate Pediatric Dentistry at ([phone]) [phone]. Children aged 4-14, please call ([phone]) [phone] to request a pediatric application. ° Dental services are provided in all areas of dental care including fillings, crowns and bridges, complete and partial dentures, implants, gum treatment, root canals, and extractions. Preventive care is also provided. Treatment is provided to both adults and children. °Patients are selected via a lottery and there is often a waiting list. °  °[facility] [address], °[place] ° (336) 763-8833 [email or web address] °  °[facility] [address], [place], [place] (336)723-1848, Ext. 123 Second and Fourth Thursday of each month, opens at 6:30 AM; Clinic ends at 9 AM.  Patients are seen on a first-come first-served basis, and a limited number are seen during each clinic.  ° °[facility] ° [address], [place], [place] (336) 723-7904    Eligibility Requirements °You must have lived in [place], [place], or [place] counties for at least the last three months. °  You cannot be eligible for state or federal sponsored healthcare insurance, including Veterans Administration, Medicaid, or Medicare. °  You generally cannot be eligible for healthcare insurance through your employer.  °  How to apply: °Eligibility screenings are held every Tuesday and Wednesday afternoon from 1:00 pm until 4:00 pm. You do not need an appointment for the interview!  °[facility] [address], [place], [place] 336-631-2330   °[facility]  336-342-8273   °[facility]  336-703-3100   °[facility]  336-570-6415   ° °Behavioral Health Resources in the Community: °Intensive Outpatient Programs °Organization         Address  Phone  Notes  °[facility] [address], [place], [place] 336-878-6098   °[facility] [address], [place], [place] 336-832-9800   °[facility] [address], [place], [place] ° 336-882-2125   °[facility] [address],  °[place], [place] 1-800-853-5163 or 336-641-4981   °Substance Abuse Resources °Organization         Address  Phone  Notes  °[facility]  336-882-2125   °[facility]  336-784-9470   °[facility]  336-285-9073   °[facility]  336-845-3988   °Residential & Outpatient Substance Abuse Program  1-800-659-3381   °Psychological Services °Organization         Address  Phone  Notes  °[facility]  336- 832-9600   °[facility]  336- 378-7881   °[facility] [address], [place] 1-800-853-5163 or 336-641-4981   ° °Mobile Crisis Teams °Organization         Address  Phone  Notes  °[facility], Mobile Crisis Care Unit  1-877-626-1772   °Assertive °[facility] ° [address]  [place], [place] 336-834-9664   °Sharon DeEsch [address] °[place] 336-554-5454   ° °Self-Help/Support Groups °Organization         Address  Phone             Notes  °[facility] of  - variety of support groups  336- 373-1402 Call for more information  °Narcotics Anonymous (NA), [facility] [address], °[place]  2 meetings at this location  ° °  Residential Treatment Programs Organization         Address  Phone  Notes  [facility] [address],    [place]  [phone]   [facility]  [address], [place] [phone], [place], [place] [phone]   [facility] [address], [place] [phone]) [phone] Admissions: 8am-3pm M-F  [facility] [address]    [place], [place] [phone]   [facility] [address], [place], [place] [phone]   [facility] [address]  [place], [place] [phone]   [facility] Intensive Outpatient [address], [place], [place] [phone]   [facility] [address]  [place], [place] [phone])[phone] or [phone](424)[phone]   [facility] [address] [place], [place] [phone] Accepts Medicaid  [facility] [address]  [place] [phone] Substance Abuse/Addiction Treatment   [facility] Resources Organization         Address  Phone  Notes  [facility]  [phone](888) [phone]   Angie FavaJulie Brannon, PhD [address], [address] [place], [place]   [phone](336) [phone] or [phone](336) [phone]   [facility]   [address] [place], [place] [phone](336) [phone]   [facility] [address], [place], [place] (905) [phone](336) [phone] Insurance/Medicaid/sponsorship through [facility]  [facility] [address]                                    [place], [place] [phone]) [phone] Therapy/tele-psych/case    [facility] [address]   [place], [place] [phone](336) [phone]    Dr. Lolly MustacheArfeen  [phone]) [phone]   [facility]  [facility] 1) [address], [place] 2) [address], [place] 3)  [address], [place] [phone](336) [phone](336) ([phone]  [phone](336) ([phone]   [facility] Child Abuse Hot[facility] 416-565-8355(336) (304)361-3965 or (303)[phone](336) [phone] (After Hours)      Take the prescriptions as directed.  Increase your fluid intake (ie:  Gatoraide) for the next few days, as discussed.  Eat a bland diet and advance to your regular diet slowly as you can tolerate it.  Call your regular GI doctor today to schedule a follow up appointment in the next 3 days.  Return to the Emergency Department immediately if not improving (or even worsening) despite taking the medicines as prescribed, any black or bloody stool or vomit, if you develop a fever over "101," or for any other concerns.

## 2015-01-10 NOTE — Telephone Encounter (Signed)
Patient's mother is notified.

## 2015-01-11 ENCOUNTER — Encounter (HOSPITAL_COMMUNITY): Payer: Self-pay | Admitting: *Deleted

## 2015-01-11 ENCOUNTER — Inpatient Hospital Stay (HOSPITAL_COMMUNITY)
Admission: EM | Admit: 2015-01-11 | Discharge: 2015-01-15 | DRG: 102 | Disposition: A | Discharge status: Home / Self Care | Payer: BLUE CROSS/BLUE SHIELD | Attending: Internal Medicine | Admitting: Internal Medicine

## 2015-01-11 DIAGNOSIS — R112 Nausea with vomiting, unspecified: Secondary | ICD-10-CM | POA: Diagnosis present

## 2015-01-11 DIAGNOSIS — K219 Gastro-esophageal reflux disease without esophagitis: Secondary | ICD-10-CM | POA: Diagnosis present

## 2015-01-11 DIAGNOSIS — G43A0 Cyclical vomiting, not intractable: Secondary | ICD-10-CM | POA: Diagnosis not present

## 2015-01-11 DIAGNOSIS — K210 Gastro-esophageal reflux disease with esophagitis: Secondary | ICD-10-CM

## 2015-01-11 DIAGNOSIS — R1110 Vomiting, unspecified: Secondary | ICD-10-CM | POA: Diagnosis not present

## 2015-01-11 DIAGNOSIS — Z79899 Other long term (current) drug therapy: Secondary | ICD-10-CM

## 2015-01-11 DIAGNOSIS — K2900 Acute gastritis without bleeding: Secondary | ICD-10-CM

## 2015-01-11 DIAGNOSIS — E43 Unspecified severe protein-calorie malnutrition: Secondary | ICD-10-CM | POA: Diagnosis present

## 2015-01-11 DIAGNOSIS — K2960 Other gastritis without bleeding: Secondary | ICD-10-CM | POA: Diagnosis present

## 2015-01-11 DIAGNOSIS — T407X5A Adverse effect of cannabis (derivatives), initial encounter: Secondary | ICD-10-CM | POA: Diagnosis present

## 2015-01-11 DIAGNOSIS — Z6822 Body mass index (BMI) 22.0-22.9, adult: Secondary | ICD-10-CM

## 2015-01-11 LAB — COMPREHENSIVE METABOLIC PANEL
ALT: 23 U/L (ref 0–53)
AST: 33 U/L (ref 0–37)
Albumin: 5.4 g/dL — ABNORMAL HIGH (ref 3.5–5.2)
Alkaline Phosphatase: 77 U/L (ref 39–117)
Anion gap: 17 — ABNORMAL HIGH (ref 5–15)
BILIRUBIN TOTAL: 1.7 mg/dL — AB (ref 0.3–1.2)
BUN: 18 mg/dL (ref 6–23)
CALCIUM: 10.1 mg/dL (ref 8.4–10.5)
CHLORIDE: 99 meq/L (ref 96–112)
CO2: 24 mmol/L (ref 19–32)
Creatinine, Ser: 1.16 mg/dL (ref 0.50–1.35)
GFR calc non Af Amer: 86 mL/min — ABNORMAL LOW (ref >90)
GLUCOSE: 130 mg/dL — AB (ref 70–99)
Potassium: 3.5 mmol/L (ref 3.5–5.1)
SODIUM: 140 mmol/L (ref 135–145)
TOTAL PROTEIN: 8.2 g/dL (ref 6.0–8.3)

## 2015-01-11 LAB — CBC WITH DIFFERENTIAL/PLATELET
Basophils Absolute: 0 10*3/uL (ref 0.0–0.1)
Basophils Relative: 0 % (ref 0–1)
EOS ABS: 0 10*3/uL (ref 0.0–0.7)
Eosinophils Relative: 0 % (ref 0–5)
HEMATOCRIT: 46.7 % (ref 39.0–52.0)
Hemoglobin: 16.7 g/dL (ref 13.0–17.0)
LYMPHS PCT: 11 % — AB (ref 12–46)
Lymphs Abs: 1.4 10*3/uL (ref 0.7–4.0)
MCH: 30.8 pg (ref 26.0–34.0)
MCHC: 35.8 g/dL (ref 30.0–36.0)
MCV: 86.2 fL (ref 78.0–100.0)
Monocytes Absolute: 1.1 10*3/uL — ABNORMAL HIGH (ref 0.1–1.0)
Monocytes Relative: 9 % (ref 3–12)
NEUTROS ABS: 10.2 10*3/uL — AB (ref 1.7–7.7)
NEUTROS PCT: 80 % — AB (ref 43–77)
PLATELETS: 266 10*3/uL (ref 150–400)
RBC: 5.42 MIL/uL (ref 4.22–5.81)
RDW: 12.1 % (ref 11.5–15.5)
WBC: 12.7 10*3/uL — ABNORMAL HIGH (ref 4.0–10.5)

## 2015-01-11 LAB — URINALYSIS, ROUTINE W REFLEX MICROSCOPIC
Glucose, UA: NEGATIVE mg/dL
HGB URINE DIPSTICK: NEGATIVE
Leukocytes, UA: NEGATIVE
Nitrite: NEGATIVE
Protein, ur: 30 mg/dL — AB
Specific Gravity, Urine: 1.03 (ref 1.005–1.030)
Urobilinogen, UA: 1 mg/dL (ref 0.0–1.0)
pH: 6 (ref 5.0–8.0)

## 2015-01-11 LAB — RAPID URINE DRUG SCREEN, HOSP PERFORMED
Amphetamines: NOT DETECTED
BARBITURATES: NOT DETECTED
Benzodiazepines: POSITIVE — AB
Cocaine: NOT DETECTED
Opiates: NOT DETECTED
Tetrahydrocannabinol: POSITIVE — AB

## 2015-01-11 LAB — LIPASE, BLOOD: LIPASE: 34 U/L (ref 11–59)

## 2015-01-11 LAB — URINE MICROSCOPIC-ADD ON

## 2015-01-11 MED ORDER — ALPRAZOLAM 0.25 MG PO TABS: 0.2500 mg | ORAL_TABLET | Freq: Three times a day (TID) | ORAL | Status: DC | PRN

## 2015-01-11 MED ORDER — ONDANSETRON HCL 4 MG PO TABS: 4.0000 mg | ORAL_TABLET | Freq: Four times a day (QID) | ORAL | Status: DC | PRN

## 2015-01-11 MED ORDER — BOOST / RESOURCE BREEZE PO LIQD
1.0000 | Freq: Two times a day (BID) | ORAL | Status: DC
  Administered 2015-01-14 – 2015-01-15 (×3): 1 via ORAL

## 2015-01-11 MED ORDER — OXYCODONE HCL 5 MG PO TABS: 5.0000 mg | ORAL_TABLET | ORAL | Status: DC | PRN

## 2015-01-11 MED ORDER — SODIUM CHLORIDE 0.9 % IV BOLUS (SEPSIS)
1000.0000 mL | Freq: Once | INTRAVENOUS | Status: AC
  Administered 2015-01-11: 1000 mL via INTRAVENOUS

## 2015-01-11 MED ORDER — MORPHINE SULFATE 2 MG/ML IJ SOLN: 2.0000 mg | INTRAMUSCULAR | Status: DC | PRN

## 2015-01-11 MED ORDER — PANTOPRAZOLE SODIUM 40 MG PO TBEC
40.0000 mg | DELAYED_RELEASE_TABLET | Freq: Two times a day (BID) | ORAL | Status: DC
  Administered 2015-01-11: 40 mg via ORAL
  Filled 2015-01-11 (×5): qty 1

## 2015-01-11 MED ORDER — PROMETHAZINE HCL 25 MG/ML IJ SOLN
12.5000 mg | Freq: Four times a day (QID) | INTRAMUSCULAR | Status: DC | PRN
  Administered 2015-01-11: 12.5 mg via INTRAVENOUS
  Filled 2015-01-11: qty 1

## 2015-01-11 MED ORDER — ACETAMINOPHEN 325 MG PO TABS: 650.0000 mg | ORAL_TABLET | Freq: Four times a day (QID) | ORAL | Status: DC | PRN

## 2015-01-11 MED ORDER — SODIUM CHLORIDE 0.9 % IV SOLN
80.0000 mg | Freq: Once | INTRAVENOUS | Status: AC
  Administered 2015-01-11: 80 mg via INTRAVENOUS
  Filled 2015-01-11: qty 80

## 2015-01-11 MED ORDER — SUCRALFATE 1 GM/10ML PO SUSP
1.0000 g | Freq: Three times a day (TID) | ORAL | Status: DC
  Administered 2015-01-11 – 2015-01-15 (×8): 1 g via ORAL
  Filled 2015-01-11 (×19): qty 10

## 2015-01-11 MED ORDER — ACETAMINOPHEN 650 MG RE SUPP: 650.0000 mg | Freq: Four times a day (QID) | RECTAL | Status: DC | PRN

## 2015-01-11 MED ORDER — SODIUM CHLORIDE 0.9 % IV SOLN
INTRAVENOUS | Status: DC
  Administered 2015-01-11 – 2015-01-14 (×8): via INTRAVENOUS
  Administered 2015-01-15: 1000 mL via INTRAVENOUS

## 2015-01-11 MED ORDER — ONDANSETRON HCL 4 MG/2ML IJ SOLN
4.0000 mg | Freq: Four times a day (QID) | INTRAMUSCULAR | Status: DC | PRN
  Administered 2015-01-11 – 2015-01-13 (×6): 4 mg via INTRAVENOUS
  Filled 2015-01-11 (×6): qty 2

## 2015-01-11 MED ORDER — ONDANSETRON HCL 4 MG/2ML IJ SOLN
4.0000 mg | Freq: Once | INTRAMUSCULAR | Status: AC
  Administered 2015-01-11: 4 mg via INTRAVENOUS
  Filled 2015-01-11: qty 2

## 2015-01-11 MED ORDER — ENOXAPARIN SODIUM 40 MG/0.4ML ~~LOC~~ SOLN
40.0000 mg | SUBCUTANEOUS | Status: DC
  Administered 2015-01-11 – 2015-01-14 (×4): 40 mg via SUBCUTANEOUS
  Filled 2015-01-11 (×6): qty 0.4

## 2015-01-11 MED ORDER — PROMETHAZINE HCL 25 MG/ML IJ SOLN
6.2500 mg | Freq: Four times a day (QID) | INTRAMUSCULAR | Status: DC | PRN
  Administered 2015-01-12 – 2015-01-13 (×5): 6.25 mg via INTRAVENOUS
  Filled 2015-01-11 (×5): qty 1

## 2015-01-11 MED ORDER — DIPHENHYDRAMINE HCL 50 MG/ML IJ SOLN
25.0000 mg | Freq: Once | INTRAMUSCULAR | Status: AC
  Administered 2015-01-11: 25 mg via INTRAVENOUS
  Filled 2015-01-11: qty 1

## 2015-01-11 MED ORDER — METOCLOPRAMIDE HCL 5 MG/ML IJ SOLN
10.0000 mg | Freq: Once | INTRAMUSCULAR | Status: AC
  Administered 2015-01-11: 10 mg via INTRAVENOUS
  Filled 2015-01-11: qty 2

## 2015-01-11 NOTE — H&P (Signed)
[facility] History and Physical  KASPER MUDRICK ZOX:[id number] DOB: [birth year] DOA: 01/11/2015  Referring physician:  PCP: No PCP Per Patient   Chief Complaint:   HPI: Peter Suarez is a 24 y.o. male with no significant and past medical history, presenting to the emergency department with complaints of intractable nausea and vomiting that started approximate 5 days ago. He has had several episodes of intractable vomiting over the past 2 months for which she has been referred to gastroenterology and has been evaluated by Dr. Leone Payor. Mr. Spanos has had extensive GI workup including upper endoscopy that was performed on 10/31/2014 that was significant for gastritis.  On 12/13/2014 he underwent complete abdominal ultrasound that did not show evidence of gallstones or other sonographic abnormalities. No further workup was recommended by his gastroenterologist as it was suspected that underlying anxiety could be a contributor to intractable nausea and vomiting. Patient stating that he has had difficulty keeping by mouth down over the past 5 days due to recurrent bouts of nausea and vomiting. He also complains of generalized abdominal discomfort/soreness. He denies hematemesis, bloody stools, fevers, chills, constipation. In the emergency room symptoms improved with the administration of IV fluids and IV anti-emetics therapy. He does not identify a significant stressor in his life, has a full-time job.                                          Review of Systems:  Constitutional:  No weight loss, night sweats, Fevers, chills, fatigue.  HEENT:  No headaches, Difficulty swallowing,Tooth/dental problems,Sore throat,  No sneezing, itching, ear ache, nasal congestion, post nasal drip,  Cardio-vascular:  No chest pain, Orthopnea, PND, swelling in lower extremities, anasarca, dizziness, palpitations  GI:  No heartburn, indigestion, positive for abdominal pain, nausea, vomiting, denies diarrhea,  change in bowel habits, loss of appetite  Resp:  No shortness of breath with exertion or at rest. No excess mucus, no productive cough, No non-productive cough, No coughing up of blood.No change in color of mucus.No wheezing.No chest wall deformity  Skin:  no rash or lesions.  GU:  no dysuria, change in color of urine, no urgency or frequency. No flank pain.  Musculoskeletal:  No joint pain or swelling. No decreased range of motion. No back pain.  Psych:  No change in mood or affect. No depression or anxiety. No memory loss.   Past Medical History  Diagnosis Date  . Anxiety   . GERD (gastroesophageal reflux disease)   . Nausea and vomiting     chronic, recurrent   Past Surgical History  Procedure Laterality Date  . Esophagogastroduodenoscopy  10/31/2014   Social History:  reports that he has never smoked. He does not have any smokeless tobacco history on file. He reports that he does not drink alcohol or use illicit drugs.  No Known Allergies  Family History  Problem Relation Age of Onset  . High blood pressure Maternal Grandmother      Prior to Admission medications   Medication Sig Start Date End Date Taking? Authorizing Provider  ALPRAZolam (XANAX) 0.25 MG tablet Take 0.25 mg by mouth 2 (two) times daily as needed for anxiety.   Yes Historical Provider, MD  omeprazole-sodium bicarbonate (ZEGERID) 40-1100 MG per capsule Take 1 capsule by mouth 2 (two) times daily. 12/13/14  Yes Heywood Iles, MD  ondansetron (ZOFRAN ODT) 4 MG disintegrating tablet Take  1 tablet (4 mg total) by mouth every 8 (eight) hours as needed for nausea or vomiting. 01/09/15  Yes Samuel JesterKathleen McManus, DO  pantoprazole (PROTONIX) 40 MG tablet Take 1 tablet (40 mg total) by mouth daily. 10/30/14  Yes Jessica D. Zehr, PA-C  promethazine (PHENERGAN) 25 MG suppository Place 1 suppository (25 mg total) rectally every 6 (six) hours as needed for nausea or vomiting. 01/09/15  Yes Samuel JesterKathleen McManus, DO  promethazine  (PHENERGAN) 25 MG tablet Take 25 mg by mouth every 6 (six) hours as needed for nausea or vomiting.   Yes Historical Provider, MD  LORazepam (ATIVAN) 1 MG tablet One half to 1 tablet PO BID prn anxiety, nausea/vomiting Patient not taking: Reported on 01/11/2015 01/09/15   Samuel JesterKathleen McManus, DO   Physical Exam: Filed Vitals:   01/11/15 0015 01/11/15 0248 01/11/15 0620  BP: 140/80 127/81 116/65  Pulse: 76 75 61  Temp: 98.2 F (36.8 C)  98.4 F (36.9 C)  TempSrc: Oral  Oral  Resp: 20 20 18   SpO2: 100% 98% 98%    Wt Readings from Last 3 Encounters:  01/09/15 79.379 kg (175 lb)  10/31/14 88.905 kg (196 lb)  10/30/14 88.905 kg (196 lb)    General: Patient does not appear to be in acute distress, he is awake, alert, pleasant, cooperative Eyes: PERRL, normal lids, irises & conjunctiva ENT: grossly normal hearing, lips & tongue Neck: no LAD, masses or thyromegaly Cardiovascular: RRR, no m/r/g. No LE edema. Telemetry: SR, no arrhythmias  Respiratory: CTA bilaterally, no w/r/r. Normal respiratory effort. Abdomen: soft, there is mild generalized tenderness to palpation, no rebound tenderness or guarding Skin: no rash or induration seen on limited exam Musculoskeletal: grossly normal tone BUE/BLE Psychiatric: grossly normal mood and affect, speech fluent and appropriate Neurologic: grossly non-focal.          Labs on Admission:  Basic Metabolic Panel:  Recent Labs Lab 01/09/15 0936 01/11/15 0041  NA 139 140  K 3.5 3.5  CL 104 99  CO2 21 24  GLUCOSE 143* 130*  BUN 20 18  CREATININE 1.09 1.16  CALCIUM 10.1 10.1   Liver Function Tests:  Recent Labs Lab 01/09/15 0936 01/11/15 0041  AST 25 33  ALT 27 23  ALKPHOS 77 77  BILITOT 1.4* 1.7*  PROT 8.5* 8.2  ALBUMIN 5.5* 5.4*    Recent Labs Lab 01/09/15 0936 01/11/15 0041  LIPASE 51 34   No results for input(s): AMMONIA in the last 168 hours. CBC:  Recent Labs Lab 01/09/15 0936 01/11/15 0041  WBC 11.3* 12.7*    NEUTROABS 9.8* 10.2*  HGB 16.1 16.7  HCT 46.2 46.7  MCV 86.8 86.2  PLT 266 266   Cardiac Enzymes: No results for input(s): CKTOTAL, CKMB, CKMBINDEX, TROPONINI in the last 168 hours.  BNP (last 3 results) No results for input(s): PROBNP in the last 8760 hours. CBG: No results for input(s): GLUCAP in the last 168 hours.  Radiological Exams on Admission: No results found.  EKG: Independently reviewed.   Assessment/Plan Principal Problem:   Intractable nausea and vomiting Active Problems:   Erosive gastritis   GERD (gastroesophageal reflux disease)   Nausea & vomiting   1. Intractable nausea and vomiting. Patient having several episodes of intractable nausea vomiting over the past several months. He has undergone GI workup that included EGD and abdominal ultrasound. The studies have not suggested source of nausea and vomiting. His gastroenterologist felt this could be anxiety driven and did not recommend further GI workups.  Will place Mr Galbraith in overnight observation, provide IV fluids and as needed IV antibiotic therapy for nausea or vomiting. Will give him some clears for now and advance his diet as tolerated. 2. Erosive gastritis. Status post EGD performed in November 2015 that revealed erosive gastritis. Will provide Protonix 40 mg by mouth twice a day along with Carafate. Also administer as needed IV antiemetic therapy for nausea and vomiting.  3. Possible anxiety. Will continue his home regimen of benzodiazepines therapy as needed.   Code Status: Full code Family Communication: I spoke to his mother was present at bedside Disposition Plan: Anticipate patient will not require greater than 2 nights hospitalization will place him in overnight observation  Time spent: 55 min  Jeralyn Bennett [facility] Pager [phone]

## 2015-01-11 NOTE — ED Notes (Signed)
PT states that he was seen here last night for vomiting; pt states that he has had no improvement; pt states that he saw his PCP today and they gave him an injection of Phenergan and he felt better for a little bit and then began vomiting again; pt states that he was also prescribed Xanax today; pt states that he saw Dr Leone PayorGessner on Wed and that they called him tonight and he advised for them to come in for admission; pt states that he has vomited TNTC episodes today and just continuously vomiting acid; pt states that he has Phenergan supp but is unable to use them due to having diarrhea after placing it.

## 2015-01-11 NOTE — ED Provider Notes (Signed)
CSN: [id number]     Arrival date & time 01/11/15  0012 History   First MD Initiated Contact with Patient 01/11/15 0157     Chief Complaint  Patient presents with  . Emesis     (Consider location/radiation/quality/duration/timing/severity/associated sxs/prior Treatment) HPI 24 year old male presents to the emergency department with persistent nausea and vomiting.  Patient was seen in the emergency department on December 17 and January 13 with similar complaints.  Patient is followed by Dr. Leone Payor with GI.  He reports having a negative endoscopy and ultrasound in the past.  It is unclear why he has nausea vomiting.  There is thought to be a anxiety component.  Patient's mother accompanies him.  She reports after receiving Ativan at his most recent ED visit, he began to have thoughts of suicide and hallucinations.  Patient was seen by me or primary care provider today who prescribed Xanax.  He has taken only one dose of the Xanax.  Despite Phenergan.  I am in the office today, he has had persistent nausea and vomiting.  He is unable to use Phenergan suppositories as he has diarrhea.  Patient reports no improvement with Zofran in the past and refuses to take Zofran.  No history of diabetes or gastroparesis diagnosis.  He has history of GERD.  Mother reports that they talk with Dr. Leone Payor around 11 PM tonight who recommended coming to the emergency department for admission. Past Medical History  Diagnosis Date  . Anxiety   . GERD (gastroesophageal reflux disease)   . Nausea and vomiting     chronic, recurrent   Past Surgical History  Procedure Laterality Date  . Esophagogastroduodenoscopy  10/31/2014   Family History  Problem Relation Age of Onset  . High blood pressure Maternal Grandmother    History  Substance Use Topics  . Smoking status: Never Smoker   . Smokeless tobacco: Not on file  . Alcohol Use: No     Comment: Rarely    Review of Systems  See History of Present Illness;  otherwise all other systems are reviewed and negative   Allergies  Review of patient's allergies indicates no known allergies.  Home Medications   Prior to Admission medications   Medication Sig Start Date End Date Taking? Authorizing Provider  ALPRAZolam (XANAX) 0.25 MG tablet Take 0.25 mg by mouth 2 (two) times daily as needed for anxiety.   Yes Historical Provider, MD  omeprazole-sodium bicarbonate (ZEGERID) 40-1100 MG per capsule Take 1 capsule by mouth 2 (two) times daily. 12/13/14  Yes Heywood Iles, MD  ondansetron (ZOFRAN ODT) 4 MG disintegrating tablet Take 1 tablet (4 mg total) by mouth every 8 (eight) hours as needed for nausea or vomiting. 01/09/15  Yes Samuel Jester, DO  pantoprazole (PROTONIX) 40 MG tablet Take 1 tablet (40 mg total) by mouth daily. 10/30/14  Yes Jessica D. Zehr, PA-C  promethazine (PHENERGAN) 25 MG suppository Place 1 suppository (25 mg total) rectally every 6 (six) hours as needed for nausea or vomiting. 01/09/15  Yes Samuel Jester, DO  promethazine (PHENERGAN) 25 MG tablet Take 25 mg by mouth every 6 (six) hours as needed for nausea or vomiting.   Yes Historical Provider, MD  LORazepam (ATIVAN) 1 MG tablet One half to 1 tablet PO BID prn anxiety, nausea/vomiting Patient not taking: Reported on 01/11/2015 01/09/15   Samuel Jester, DO   BP 127/81 mmHg  Pulse 75  Temp(Src) 98.2 F (36.8 C) (Oral)  Resp 20  SpO2 98% Physical Exam  Constitutional:  He is oriented to person, place, and time. He appears well-developed and well-nourished.  HENT:  Head: Normocephalic and atraumatic.  Nose: Nose normal.  Dry mucous membranes  Eyes: Conjunctivae and EOM are normal. Pupils are equal, round, and reactive to light.  Neck: Normal range of motion. Neck supple. No JVD present. No tracheal deviation present. No thyromegaly present.  Cardiovascular: Normal rate, regular rhythm, normal heart sounds and intact distal pulses.  Exam reveals no gallop and no friction rub.    No murmur heard. Pulmonary/Chest: Effort normal and breath sounds normal. No stridor. No respiratory distress. He has no wheezes. He has no rales. He exhibits no tenderness.  Abdominal: Soft. Bowel sounds are normal. He exhibits no distension and no mass. There is tenderness (patient with diffuse tenderness to palpation, mainly in epigastrium). There is no rebound and no guarding.  Musculoskeletal: Normal range of motion. He exhibits no edema or tenderness.  Lymphadenopathy:    He has no cervical adenopathy.  Neurological: He is alert and oriented to person, place, and time. He displays normal reflexes. He exhibits normal muscle tone. Coordination normal.  Skin: Skin is warm and dry. No rash noted. No erythema. No pallor.  Psychiatric: Judgment and thought content normal.  Flat affect, does not make eye contact.  Mother does most of the speaking  Nursing note and vitals reviewed.   ED Course  Procedures (including critical care time) Labs Review Labs Reviewed  CBC WITH DIFFERENTIAL - Abnormal; Notable for the following:    WBC 12.7 (*)    Neutrophils Relative % 80 (*)    Neutro Abs 10.2 (*)    Lymphocytes Relative 11 (*)    Monocytes Absolute 1.1 (*)    All other components within normal limits  URINALYSIS, ROUTINE W REFLEX MICROSCOPIC - Abnormal; Notable for the following:    Color, Urine AMBER (*)    Bilirubin Urine MODERATE (*)    Ketones, ur >80 (*)    Protein, ur 30 (*)    All other components within normal limits  URINE MICROSCOPIC-ADD ON  COMPREHENSIVE METABOLIC PANEL  LIPASE, BLOOD    Imaging Review No results found.   EKG Interpretation None      Results for orders placed or performed during the hospital encounter of 01/11/15  CBC with Differential  Result Value Ref Range   WBC 12.7 (H) 4.0 - 10.5 K/uL   RBC 5.42 4.22 - 5.81 MIL/uL   Hemoglobin 16.7 13.0 - 17.0 g/dL   HCT 91.4 78.2 - 95.6 %   MCV 86.2 78.0 - 100.0 fL   MCH 30.8 26.0 - 34.0 pg   MCHC 35.8  30.0 - 36.0 g/dL   RDW 21.3 08.6 - 57.8 %   Platelets 266 150 - 400 K/uL   Neutrophils Relative % 80 (H) 43 - 77 %   Neutro Abs 10.2 (H) 1.7 - 7.7 K/uL   Lymphocytes Relative 11 (L) 12 - 46 %   Lymphs Abs 1.4 0.7 - 4.0 K/uL   Monocytes Relative 9 3 - 12 %   Monocytes Absolute 1.1 (H) 0.1 - 1.0 K/uL   Eosinophils Relative 0 0 - 5 %   Eosinophils Absolute 0.0 0.0 - 0.7 K/uL   Basophils Relative 0 0 - 1 %   Basophils Absolute 0.0 0.0 - 0.1 K/uL  Urinalysis, Routine w reflex microscopic  Result Value Ref Range   Color, Urine AMBER (A) YELLOW   APPearance CLEAR CLEAR   Specific Gravity, Urine 1.030 1.005 -  1.030   pH 6.0 5.0 - 8.0   Glucose, UA NEGATIVE NEGATIVE mg/dL   Hgb urine dipstick NEGATIVE NEGATIVE   Bilirubin Urine MODERATE (A) NEGATIVE   Ketones, ur >80 (A) NEGATIVE mg/dL   Protein, ur 30 (A) NEGATIVE mg/dL   Urobilinogen, UA 1.0 0.0 - 1.0 mg/dL   Nitrite NEGATIVE NEGATIVE   Leukocytes, UA NEGATIVE NEGATIVE  Urine microscopic-add on  Result Value Ref Range   WBC, UA 0-2 <3 WBC/hpf   [place] Abdomen Complete  12/13/2014   CLINICAL DATA:  Intractable vomiting with nausea.  EXAM: ULTRASOUND ABDOMEN COMPLETE  COMPARISON:  None.  FINDINGS: Gallbladder: No gallstones or wall thickening visualized. No sonographic Murphy sign noted.  Common bile duct: Diameter: 3 mm  Liver: No focal lesion identified. Within normal limits in parenchymal echogenicity.  IVC: No abnormality visualized.  Pancreas: Visualized portion unremarkable.  Spleen: Size and appearance within normal limits.  Right Kidney: Length: 9.9 cm. Echogenicity within normal limits. No mass or hydronephrosis visualized.  Left Kidney: Length: 9.9 cm. Echogenicity within normal limits. No mass or hydronephrosis visualized.  Abdominal aorta: No aneurysm visualized.  Other findings: None.  IMPRESSION: Negative. No evidence of gallstones or other sonographic abnormality.   Electronically Signed   By: Myles RosenthalJohn  Stahl M.D.   On: 12/13/2014  10:40      MDM   Final diagnoses:  Intractable vomiting with nausea, vomiting of unspecified type   24 year old male with persistent nausea and vomiting over the last 4-5 days.  This is his second ED visit this week.  Ketones greater than 80, and his urine.  Will try Reglan and Benadryl with fluids.  If he is able to tolerate by mouth, may be able to transition him home with prescription for Reglan.  As patient had suicidal thoughts with Ativan, I would hesitate to continue his Xanax prescription given.  Concern for persistent SI.  Patient reports he is in the process of getting seen by psychiatrist, but not does not yet have an appointment.  If after Reglan Benadryl.  He is unable to tolerate by mouth, will discuss with hospitalist for admission.  5:03 AM Patient is feeling better after Reglan and Benadryl, and wishes to try some ginger ale and crackers.  7:21 AM Patient reports after eating and drinking.  He is beginning to feel nauseated and feels that he will throw up soon.  Discuss with hospitalist for admission.  Olivia Mackielga M Scotti Motter, MD 01/11/15 [phone]

## 2015-01-11 NOTE — Progress Notes (Signed)
INITIAL NUTRITION ASSESSMENT  DOCUMENTATION CODES Per approved criteria  -Severe malnutrition in the context of acute illness or injury  Pt meets criteria for severe MALNUTRITION in the context of acute illness as evidenced by PO intake < 50% for > 5 days, 10% body weight loss in 2 months.   INTERVENTION: -Recommend Resource Breeze po BID, each supplement provides 250 kcal and 9 grams of protein -Provided nutrition therapy education handouts regarding GERD and nausea/vomiting -Provided RD contact information for additional assistance/nutrition related concerns -RD to continue to monitor  NUTRITION DIAGNOSIS: Inadequate oral intake related to nausea/vomiting as evidenced by PO intake < 50%, 18 lb wt loss.   Goal: Pt to meet >/= 90% of their estimated nutrition needs    Monitor:  Diet order, GI profile, total protein/energy intake, education needs  Reason for Assessment: MST  24 y.o. male  Admitting Dx: Intractable nausea and vomiting  ASSESSMENT: Peter Suarez is a 24 y.o. male with no significant and past medical history, presenting to the emergency department with complaints of intractable nausea and vomiting that started approximate 5 days ago  -Pt reported minimal intake of solids and liquids for past 5 days d/t nausea and vomiting. Has hx of GERD, and has not been able to tolerate liquids or receive proper medication d/t vomiting. -Endorsed 20 lb wt loss in past 2 months  (10% body weight loss, severe for time frame) -Has only recently been able to tolerate small amount of gingerale today. Has tried [employer]; however disliked d/t sweetness; reviewed ways to increase supplements palatability. -Reviewed nutrition therapy for GERD and nausea; provided handouts from the [employer] -Pt willing to consume Resource Breeze, provided sample and pt tolerated.    Height: Ht Readings from Last 1 Encounters:  01/11/15  (1.88 m)     Weight: Wt Readings from Last 1 Encounters:  01/11/15 173 lb (78.472 kg)    Ideal Body Weight: 190 lb  % Ideal Body Weight: 92%  Wt Readings from Last 10 Encounters:  01/11/15 173 lb (78.472 kg)  01/09/15 175 lb (79.379 kg)  10/31/14 196 lb (88.905 kg)  10/30/14 196 lb (88.905 kg)    Usual Body Weight: 195-200 lb  % Usual Body Weight: 89%  BMI:  Body mass index is 22.2 kg/(m^2).  Estimated Nutritional Needs: Kcal: 2000-2200 Protein: 80-90 gram Fluid: >/= 2200 ml daily  Skin: WDL  Diet Order: Diet clear liquid  EDUCATION NEEDS: -Education needs addressed   Intake/Output Summary (Last 24 hours) at 01/11/15 1602 Last data filed at 01/11/15 1428  Gross per 24 hour  Intake   1000 ml  Output    350 ml  Net    650 ml    Last BM: WDL   Labs:   Recent Labs Lab 01/09/15 0936 01/11/15 0041  NA 139 140  K 3.5 3.5  CL 104 99  CO2 21 24  BUN 20 18  CREATININE 1.09 1.16  CALCIUM 10.1 10.1  GLUCOSE 143* 130*    CBG (last 3)  No results for input(s): GLUCAP in the last 72 hours.  Scheduled Meds: . enoxaparin (LOVENOX) injection  40 mg Subcutaneous Q24H  . pantoprazole  40 mg Oral BID  . sucralfate  1 g Oral TID WC & HS    Continuous Infusions: . sodium chloride 125 mL/hr at 01/11/15 1452    Past Medical History  Diagnosis Date  . Anxiety   . GERD (gastroesophageal reflux disease)   .  Nausea and vomiting     chronic, recurrent    Past Surgical History  Procedure Laterality Date  . Esophagogastroduodenoscopy  10/31/2014    Lloyd HugerSarah F Marquia Costello MS RD LDN Clinical Dietitian [phone]

## 2015-01-11 NOTE — ED Notes (Signed)
Family at bedside. 

## 2015-01-11 NOTE — ED Notes (Signed)
Patient taking sips of fluids, and a few bites of crackers. Tolerating well at this time.

## 2015-01-12 DIAGNOSIS — K2960 Other gastritis without bleeding: Secondary | ICD-10-CM | POA: Diagnosis present

## 2015-01-12 DIAGNOSIS — Z79899 Other long term (current) drug therapy: Secondary | ICD-10-CM | POA: Diagnosis not present

## 2015-01-12 DIAGNOSIS — E43 Unspecified severe protein-calorie malnutrition: Secondary | ICD-10-CM | POA: Insufficient documentation

## 2015-01-12 DIAGNOSIS — G43A0 Cyclical vomiting, not intractable: Secondary | ICD-10-CM | POA: Diagnosis present

## 2015-01-12 DIAGNOSIS — Z6822 Body mass index (BMI) 22.0-22.9, adult: Secondary | ICD-10-CM | POA: Diagnosis not present

## 2015-01-12 DIAGNOSIS — T407X5A Adverse effect of cannabis (derivatives), initial encounter: Secondary | ICD-10-CM | POA: Diagnosis present

## 2015-01-12 DIAGNOSIS — R1110 Vomiting, unspecified: Secondary | ICD-10-CM | POA: Diagnosis present

## 2015-01-12 DIAGNOSIS — K219 Gastro-esophageal reflux disease without esophagitis: Secondary | ICD-10-CM | POA: Diagnosis present

## 2015-01-12 DIAGNOSIS — G43A1 Cyclical vomiting, intractable: Secondary | ICD-10-CM

## 2015-01-12 LAB — COMPREHENSIVE METABOLIC PANEL
ALBUMIN: 4.1 g/dL (ref 3.5–5.2)
ALK PHOS: 59 U/L (ref 39–117)
ALT: 15 U/L (ref 0–53)
AST: 24 U/L (ref 0–37)
Anion gap: 8 (ref 5–15)
BILIRUBIN TOTAL: 1.4 mg/dL — AB (ref 0.3–1.2)
BUN: 10 mg/dL (ref 6–23)
CALCIUM: 8.8 mg/dL (ref 8.4–10.5)
CHLORIDE: 103 meq/L (ref 96–112)
CO2: 27 mmol/L (ref 19–32)
CREATININE: 1 mg/dL (ref 0.50–1.35)
GFR calc Af Amer: >90 mL/min (ref >90)
GFR calc non Af Amer: >90 mL/min (ref >90)
Glucose, Bld: 94 mg/dL (ref 70–99)
POTASSIUM: 3.5 mmol/L (ref 3.5–5.1)
Sodium: 138 mmol/L (ref 135–145)
TOTAL PROTEIN: 6.2 g/dL (ref 6.0–8.3)

## 2015-01-12 LAB — CBC
HCT: 39.5 % (ref 39.0–52.0)
Hemoglobin: 13.3 g/dL (ref 13.0–17.0)
MCH: 30.2 pg (ref 26.0–34.0)
MCHC: 33.7 g/dL (ref 30.0–36.0)
MCV: 89.6 fL (ref 78.0–100.0)
Platelets: 178 10*3/uL (ref 150–400)
RBC: 4.41 MIL/uL (ref 4.22–5.81)
RDW: 12.3 % (ref 11.5–15.5)
WBC: 7.3 10*3/uL (ref 4.0–10.5)

## 2015-01-12 MED ORDER — PANTOPRAZOLE SODIUM 40 MG IV SOLR
40.0000 mg | Freq: Once | INTRAVENOUS | Status: AC
Start: 2015-01-12 — End: 2015-01-12
  Administered 2015-01-12: 40 mg via INTRAVENOUS
  Filled 2015-01-12: qty 40

## 2015-01-12 MED ORDER — METOCLOPRAMIDE HCL 5 MG/ML IJ SOLN
5.0000 mg | Freq: Three times a day (TID) | INTRAMUSCULAR | Status: DC | PRN
  Administered 2015-01-12: 15:00:00 via INTRAVENOUS
  Filled 2015-01-12: qty 2

## 2015-01-12 NOTE — Progress Notes (Signed)
[facility] PROGRESS NOTE  ARLEIGH ODOWD ZOX:[id number] DOB: 10/31/1991 DOA: 01/11/2015 PCP: No PCP Per Patient  Assessment/Plan: 1. 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 -Patient undergoing GI workup in past that included EGD and abdominal U/S. Work up has been unremarkable -He admits to smoking marijuana on a daily bases, as his urine drug screen came back positive for THC -I think this likely represents cyclical hyperemesis related to [facility] use. Spoke with patient at length regarding this as he voiced his understanding and reassured me that he would quit.  -He had some N/V this morning, will continue supportive care, provide IV reglan, hopefully will begin to improve in the next 24 hours  2.  GERD -Continue PPI and carafate therapy  Code Status: Full Code Family Communication: I spoke to his mother at bedside Disposition Plan: Continue supportive care, anticipate discharge in the next 24 hours    HPI/Subjective: Patient is a pleasant 24 year old with no significant past medical history admitted for multiple episodes of nausea and vomiting, unable to hold PO down. He was started on IV fluids and given IV antiemetic therapy. Drug screen came back positive for THC. Spoke with patient regarding this who admitted to smoking marijuana on a daily basis. I suspect symptoms secondary to cyclical hyperemesis from chronic marijuana use.   Objective: Filed Vitals:   01/12/15 0603  BP: 121/70  Pulse: 60  Temp: 98.4 F (36.9 C)  Resp: 16    Intake/Output Summary (Last 24 hours) at 01/12/15 1222 Last data filed at 01/12/15 0604  Gross per 24 hour  Intake    400 ml  Output    350 ml  Net     50 ml   Filed Weights   01/11/15 1427  Weight: 78.472 kg (173 lb)    Exam:   General:  No acute distress, he is awake and alert, following commands  Cardiovascular: Regular rate and rhythm, normal S1S2  Respiratory: Normal inspiratory effort, lungs are clear to  auscultation  Abdomen: Soft, nontender nondistended  Musculoskeletal: no edema  Data Reviewed: Basic Metabolic Panel:  Recent Labs Lab 01/09/15 0936 01/11/15 0041 01/12/15 0524  NA 139 140 138  K 3.5 3.5 3.5  CL 104 99 103  CO2 GLUCOSE 143* 130* 94  BUN CREATININE 1.09 1.16 1.00  CALCIUM 10.1 10.1 8.8   Liver Function Tests:  Recent Labs Lab 01/09/15 0936 01/11/15 0041 01/12/15 0524  AST 25 33 24  ALT ALKPHOS 77 77 59  BILITOT 1.4* 1.7* 1.4*  PROT 8.5* 8.2 6.2  ALBUMIN 5.5* 5.4* 4.1    Recent Labs Lab 01/09/15 0936 01/11/15 0041  LIPASE 51 34   No results for input(s): AMMONIA in the last 168 hours. CBC:  Recent Labs Lab 01/09/15 0936 01/11/15 0041 01/12/15 0524  WBC 11.3* 12.7* 7.3  NEUTROABS 9.8* 10.2*  --   HGB 16.1 16.7 13.3  HCT 46.2 46.7 39.5  MCV 86.8 86.2 89.6  PLT 266 266 178   Cardiac Enzymes: No results for input(s): CKTOTAL, CKMB, CKMBINDEX, TROPONINI in the last 168 hours. BNP (last 3 results) No results for input(s): PROBNP in the last 8760 hours. CBG: No results for input(s): GLUCAP in the last 168 hours.  No results found for this or any previous visit (from the past 240 hour(s)).   Studies: No results found.  Scheduled Meds: . enoxaparin (LOVENOX) injection  40 mg Subcutaneous Q24H  .  feeding supplement (RESOURCE BREEZE)  1 Container Oral BID BM  . pantoprazole  40 mg Oral BID  . sucralfate  1 g Oral TID WC & HS   Continuous Infusions: . sodium chloride 125 mL/hr at 01/12/15 [id number]    Principal Problem:   Intractable nausea and vomiting Active Problems:   Erosive gastritis   GERD (gastroesophageal reflux disease)   Nausea & vomiting   Protein-calorie malnutrition, severe    Time spent: 25 min    Jeralyn BennettZAMORA, Jerney Baksh  [facility] Pager [phone]. If 7PM-7AM, please contact night-coverage at [email or web address], password [email or web address] 01/12/2015, 12:22 PM  LOS: 1 day

## 2015-01-12 NOTE — Progress Notes (Signed)
Utilization Review completed.  

## 2015-01-13 MED ORDER — PANTOPRAZOLE SODIUM 40 MG IV SOLR
40.0000 mg | Freq: Two times a day (BID) | INTRAVENOUS | Status: DC
  Administered 2015-01-13 – 2015-01-14 (×2): 40 mg via INTRAVENOUS
  Filled 2015-01-13 (×4): qty 40

## 2015-01-13 MED ORDER — METOCLOPRAMIDE HCL 5 MG/ML IJ SOLN
10.0000 mg | Freq: Three times a day (TID) | INTRAMUSCULAR | Status: DC
  Administered 2015-01-13 – 2015-01-14 (×3): 10 mg via INTRAVENOUS
  Filled 2015-01-13 (×6): qty 2

## 2015-01-13 NOTE — Progress Notes (Signed)
[facility] PROGRESS NOTE  Beverly SessionsMark G Heinbaugh VWU:[id number] DOB: [birth year] DOA: 01/11/2015 PCP: No PCP Per Patient  Interim summary Patient is a pleasant 24 year old with no significant past medical history admitted for multiple episodes of nausea and vomiting, unable to hold PO down. He was started on IV fluids and given IV antiemetic therapy. In the past he has been worked up by GI for periodic episodes of nausea and vomiting. He underwent upper endoscopy on 10/31/2014 that was significant for gastritis. On 12/13/2014 he had an abdominal ultrasound that did not show evidence of gallstones or other sonographic abnormalities to explain symptoms. He continues to have a benign abdominal exam, white count normal, afebrile, nontoxic appearing. Drug screen on this admission came back positive for THC. Spoke with patient regarding this who admitted to chronically smoking marijuana on a daily basis prior to this hospitalization. I suspect symptoms secondary to cyclical hyperemesis from chronic marijuana use.   Assessment/Plan: 1. 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 -Patient undergoing GI workup in past that included EGD and abdominal U/S. Work up has been unremarkable -He admits to smoking marijuana on a daily bases, as his urine drug screen came back positive for THC -I think this likely represents cyclical hyperemesis [facility]. Spoke with patient at length regarding this as he voiced his understanding and reassured me that he would quit.  -Will schedule Reglan 10 mg IV 3 times a day. He remains afebrile, nontoxic, hemodynamically stable, and has a benign abdominal exam.  2.  GERD -Continue PPI and carafate therapy  Code Status: Full Code Family Communication: I spoke to his mother at bedside Disposition Plan: Continue supportive care    HPI/Subjective: Patient having one episode of nausea and vomiting today, states that showers alleviate symptoms. Denies  abdominal pain, bloody emesis, bloody stools, melena.  Objective: Filed Vitals:   01/13/15 0459  BP: 112/64  Pulse: 60  Temp: 98.6 F (37 C)  Resp: 16    Intake/Output Summary (Last 24 hours) at 01/13/15 1132 Last data filed at 01/13/15 0459  Gross per 24 hour  Intake    690 ml  Output      0 ml  Net    690 ml   Filed Weights   01/11/15 1427  Weight: 78.472 kg (173 lb)    Exam:   General:  No acute distress, he is awake and alert, following commands  Cardiovascular: Regular rate and rhythm, normal S1S2  Respiratory: Normal inspiratory effort, lungs are clear to auscultation  Abdomen: Soft, nontender nondistended  Musculoskeletal: no edema  Data Reviewed: Basic Metabolic Panel:  Recent Labs Lab 01/09/15 0936 01/11/15 0041 01/12/15 0524  NA 139 140 138  K 3.5 3.5 3.5  CL 104 99 103  CO2 21 24 27   GLUCOSE 143* 130* 94  BUN 20 18 10   CREATININE 1.09 1.16 1.00  CALCIUM 10.1 10.1 8.8   Liver Function Tests:  Recent Labs Lab 01/09/15 0936 01/11/15 0041 01/12/15 0524  AST 25 33 24  ALT 27 23 15   ALKPHOS 77 77 59  BILITOT 1.4* 1.7* 1.4*  PROT 8.5* 8.2 6.2  ALBUMIN 5.5* 5.4* 4.1    Recent Labs Lab 01/09/15 0936 01/11/15 0041  LIPASE 51 34   No results for input(s): AMMONIA in the last 168 hours. CBC:  Recent Labs Lab 01/09/15 0936 01/11/15 0041 01/12/15 0524  WBC 11.3* 12.7* 7.3  NEUTROABS 9.8* 10.2*  --   HGB 16.1 16.7 13.3  HCT  46.2 46.7 39.5  MCV 86.8 86.2 89.6  PLT 266 266 178   Cardiac Enzymes: No results for input(s): CKTOTAL, CKMB, CKMBINDEX, TROPONINI in the last 168 hours. BNP (last 3 results) No results for input(s): PROBNP in the last 8760 hours. CBG: No results for input(s): GLUCAP in the last 168 hours.  No results found for this or any previous visit (from the past 240 hour(s)).   Studies: No results found.  Scheduled Meds: . enoxaparin (LOVENOX) injection  40 mg Subcutaneous Q24H  . feeding supplement  (RESOURCE BREEZE)  1 Container Oral BID BM  . metoCLOPramide (REGLAN) injection  10 mg Intravenous 3 times per day  . pantoprazole  40 mg Oral BID  . sucralfate  1 g Oral TID WC & HS   Continuous Infusions: . sodium chloride 125 mL/hr at 01/13/15 9604    Principal Problem:   Intractable nausea and vomiting Active Problems:   Erosive gastritis   GERD (gastroesophageal reflux disease)   Nausea & vomiting   Protein-calorie malnutrition, severe    Time spent: 25 min    Jeralyn Bennett  [facility] Pager [phone]. If 7PM-7AM, please contact night-coverage at [email or web address], password [facility] 01/13/2015, 11:32 AM  LOS: 2 days

## 2015-01-14 MED ORDER — PANTOPRAZOLE SODIUM 40 MG PO TBEC
40.0000 mg | DELAYED_RELEASE_TABLET | Freq: Two times a day (BID) | ORAL | Status: DC
  Administered 2015-01-14 – 2015-01-15 (×3): 40 mg via ORAL
  Filled 2015-01-14 (×4): qty 1

## 2015-01-14 MED ORDER — METOCLOPRAMIDE HCL 10 MG PO TABS
10.0000 mg | ORAL_TABLET | Freq: Three times a day (TID) | ORAL | Status: DC
  Administered 2015-01-14 – 2015-01-15 (×3): 10 mg via ORAL
  Filled 2015-01-14 (×5): qty 1

## 2015-01-14 MED ORDER — PROMETHAZINE HCL 25 MG RE SUPP: 25.0000 mg | Freq: Three times a day (TID) | RECTAL | Status: DC | PRN

## 2015-01-14 NOTE — Progress Notes (Signed)
[facility] PROGRESS NOTE  Peter Suarez WUJ:[id number] DOB: 10/02/1991 DOA: 01/11/2015 PCP: No PCP Per Patient  Interim summary Patient is a pleasant 24 year old with no significant past medical history admitted for multiple episodes of nausea and vomiting, unable to hold PO down. He was started on IV fluids and given IV antiemetic therapy. In the past he has been worked up by GI for periodic episodes of nausea and vomiting. He underwent upper endoscopy on 10/31/2014 that was significant for gastritis. On 12/13/2014 he had an abdominal ultrasound that did not show evidence of gallstones or other sonographic abnormalities to explain symptoms. He continues to have a benign abdominal exam, white count normal, afebrile, nontoxic appearing. Drug screen on this admission came back positive for THC. Spoke with patient regarding this who admitted to chronically smoking marijuana on a daily basis prior to this hospitalization. I suspect symptoms secondary to cyclical hyperemesis from chronic marijuana use.   Assessment/Plan: 1. 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 -Patient undergoing GI workup in past that included EGD and abdominal U/S. Work up has been unremarkable -He admits to smoking marijuana on a daily bases, as his urine drug screen came back positive for THC -I think this likely represents cyclical hyperemesis [facility]. Spoke with patient at length regarding this as he voiced his understanding and reassured me that he would quit.  -Change Reglan to by mouth. Change Protonix to by mouth. Advance diet as tolerated Discharge home in the morning if stable    2.  GERD -Continue PPI and carafate therapy  Code Status: Full Code Family Communication: I spoke to his mother at bedside Disposition Plan: Continue supportive care    HPI/Subjective: No episodes of nausea and vomiting today, states that showers alleviate symptoms. Denies abdominal pain, bloody  emesis, bloody stools, melena.  Objective: Filed Vitals:   01/14/15 0539  BP: 125/72  Pulse: 70  Temp: 98 F (36.7 C)  Resp: 16   No intake or output data in the 24 hours ending 01/14/15 1346 Filed Weights   01/11/15 1427  Weight: 78.472 kg (173 lb)    Exam:   General:  No acute distress, he is awake and alert, following commands  Cardiovascular: Regular rate and rhythm, normal S1S2  Respiratory: Normal inspiratory effort, lungs are clear to auscultation  Abdomen: Soft, nontender nondistended  Musculoskeletal: no edema  Data Reviewed: Basic Metabolic Panel:  Recent Labs Lab 01/09/15 0936 01/11/15 0041 01/12/15 0524  NA 139 140 138  K 3.5 3.5 3.5  CL 104 99 103  CO2 21 24 27   GLUCOSE 143* 130* 94  BUN 20 18 10   CREATININE 1.09 1.16 1.00  CALCIUM 10.1 10.1 8.8   Liver Function Tests:  Recent Labs Lab 01/09/15 0936 01/11/15 0041 01/12/15 0524  AST 25 33 24  ALT 27 23 15   ALKPHOS 77 77 59  BILITOT 1.4* 1.7* 1.4*  PROT 8.5* 8.2 6.2  ALBUMIN 5.5* 5.4* 4.1    Recent Labs Lab 01/09/15 0936 01/11/15 0041  LIPASE 51 34   No results for input(s): AMMONIA in the last 168 hours. CBC:  Recent Labs Lab 01/09/15 0936 01/11/15 0041 01/12/15 0524  WBC 11.3* 12.7* 7.3  NEUTROABS 9.8* 10.2*  --   HGB 16.1 16.7 13.3  HCT 46.2 46.7 39.5  MCV 86.8 86.2 89.6  PLT 266 266 178   Cardiac Enzymes: No results for input(s): CKTOTAL, CKMB, CKMBINDEX, TROPONINI in the last 168 hours. BNP (last 3 results)  No results for input(s): PROBNP in the last 8760 hours. CBG: No results for input(s): GLUCAP in the last 168 hours.  No results found for this or any previous visit (from the past 240 hour(s)).   Studies: No results found.  Scheduled Meds: . enoxaparin (LOVENOX) injection  40 mg Subcutaneous Q24H  . feeding supplement (RESOURCE BREEZE)  1 Container Oral BID BM  . metoCLOPramide  10 mg Oral TID AC  . pantoprazole  40 mg Oral BID  . sucralfate  1 g  Oral TID WC & HS   Continuous Infusions: . sodium chloride 100 mL/hr at 01/14/15 0535    Principal Problem:   Intractable nausea and vomiting Active Problems:   Erosive gastritis   GERD (gastroesophageal reflux disease)   Nausea & vomiting   Protein-calorie malnutrition, severe    Time spent: 25 min    [facility]  [facility] Pager [phone]. If 7PM-7AM, please contact night-coverage at [email or web address], password [facility] 01/14/2015, 1:46 PM  LOS: 3 days

## 2015-01-15 MED ORDER — PROMETHAZINE HCL 25 MG RE SUPP: 25.0000 mg | Freq: Four times a day (QID) | RECTAL | Status: DC | PRN

## 2015-01-15 MED ORDER — PROMETHAZINE HCL 25 MG RE SUPP: 25.0000 mg | Freq: Three times a day (TID) | RECTAL | Status: DC | PRN

## 2015-01-15 MED ORDER — METOCLOPRAMIDE HCL 10 MG PO TABS: 10.0000 mg | ORAL_TABLET | Freq: Three times a day (TID) | ORAL | Status: DC

## 2015-01-15 MED ORDER — PANTOPRAZOLE SODIUM 40 MG PO TBEC: 40.0000 mg | DELAYED_RELEASE_TABLET | Freq: Two times a day (BID) | ORAL | Status: DC

## 2015-01-15 MED ORDER — SUCRALFATE 1 GM/10ML PO SUSP: 1.0000 g | Freq: Three times a day (TID) | ORAL | Status: DC

## 2015-01-15 NOTE — Care Management Note (Signed)
CARE MANAGEMENT NOTE 01/15/2015  Patient:  Peter Suarez, Peter Suarez   Account Number:  [id number]  Date Initiated:  01/15/2015  Documentation initiated by:  Marney Doctor  Subjective/Objective Assessment:   24 yo admitted with Intractable nausea and vomitting     Action/Plan:   From home with friends   Anticipated DC Date:  01/15/2015   Anticipated DC Plan:  [place]  CM consult      Choice offered to / List presented to:             Status of service:  Completed, signed off Medicare Important Message given?   (If response is "NO", the following Medicare IM given date fields will be blank) Date Medicare IM given:   Medicare IM given by:   Date Additional Medicare IM given:   Additional Medicare IM given by:    Discharge Disposition:    Per UR Regulation:  Reviewed for med. necessity/level of care/duration of stay  If discussed at [place] of Stay Meetings, dates discussed:    Comments:  01/15/15 Marney Doctor RN,BSN,NCM [phone] CM consult for PCP.  Met with patient at bedside about PCP. Pt states that his PCP is Dr. Moreen Fowler at [place] and his plan is to follow up with him at DC. No other CM needs

## 2015-01-15 NOTE — Discharge Summary (Signed)
Physician Discharge Summary  BRAVERY KETCHAM MRN: [id number] DOB/AGE: 26/07/1991 23 y.o.  PCP: No PCP Per Patient   Admit date: 01/11/2015 Discharge date: 01/15/2015  Discharge Diagnoses:      Intractable nausea and vomiting suspected to be secondary to gastroesophageal reflux disease as well as marijuana use   Erosive gastritis   GERD (gastroesophageal reflux disease)   Nausea & vomiting   Protein-calorie malnutrition, severe  Follow-up recommendations Follow-up with PCP in 5-7 days    Medication List    STOP taking these medications        omeprazole-sodium bicarbonate 40-1100 MG per capsule  Commonly known as:  ZEGERID     ondansetron 4 MG disintegrating tablet  Commonly known as:  ZOFRAN ODT     promethazine 25 MG tablet  Commonly known as:  PHENERGAN  Replaced by:  promethazine 25 MG suppository  You also have another medication with the same name that you need to continue taking as instructed.      TAKE these medications        ALPRAZolam 0.25 MG tablet  Commonly known as:  XANAX  Take 0.25 mg by mouth 2 (two) times daily as needed for anxiety.     LORazepam 1 MG tablet  Commonly known as:  ATIVAN  One half to 1 tablet PO BID prn anxiety, nausea/vomiting     metoCLOPramide 10 MG tablet  Commonly known as:  REGLAN  Take 1 tablet (10 mg total) by mouth 3 (three) times daily before meals.     pantoprazole 40 MG tablet  Commonly known as:  PROTONIX  Take 1 tablet (40 mg total) by mouth 2 (two) times daily.     promethazine 25 MG suppository  Commonly known as:  PHENERGAN  Place 1 suppository (25 mg total) rectally every 6 (six) hours as needed for nausea or vomiting.     promethazine 25 MG suppository  Commonly known as:  PHENERGAN  Place 1 suppository (25 mg total) rectally every 8 (eight) hours as needed for nausea or vomiting.     sucralfate 1 GM/10ML suspension  Commonly known as:  CARAFATE  Take 10 mLs (1 g total) by mouth 4 (four) times  daily -  with meals and at bedtime.        Discharge Condition: Stable Disposition: 01-Home or Self Care   Consults: None   Significant Diagnostic Studies: No results found.    Microbiology: No results found for this or any previous visit (from the past 240 hour(s)).   Labs: No results found for this or any previous visit (from the past 48 hour(s)).   Interim summary Patient is a pleasant 24 year old with no significant past medical history admitted for multiple episodes of nausea and vomiting, unable to hold PO down. He was started on IV fluids and given IV antiemetic therapy. In the past he has been worked up by GI for periodic episodes of nausea and vomiting. He underwent upper endoscopy on 10/31/2014 that was significant for gastritis. On 12/13/2014 he had an abdominal ultrasound that did not show evidence of gallstones or other sonographic abnormalities to explain symptoms. He continues to have a benign abdominal exam, white count normal, afebrile, nontoxic appearing. Drug screen on this admission came back positive for THC. Spoke with patient regarding this who admitted to chronically smoking marijuana on a daily basis prior to this hospitalization. I suspect symptoms secondary to cyclical hyperemesis from chronic marijuana use.   Assessment/Plan:  Probable  cyclical hyperemesis from chronic marijuana use. -Patient undergoing GI workup in past that included EGD and abdominal U/S. Work up has been unremarkable -He admits to smoking marijuana on a daily bases, as his urine drug screen came back positive for THC -I think this likely represents [facility]ical [facility]remesis [facility] Spoke with patient at length regarding this as he voiced his understanding and reassured me that he would quit.  Symptoms improved after the patient was started on aggressive antireflux therapy including sucralfate, Protonix twice a day, Reglan for nausea Tolerated soft diet, able to hydrate himself  for 24 hours Patient requesting to discharge home today    2. GERD -Continue PPI and carafate therapy  Code Status: Full Code  Discharge Exam:  Blood pressure 116/75, pulse 62, temperature 98.2 F (36.8 C), temperature source Oral, resp. rate 16, height 6\' 2"  (1.88 m), weight 78.472 kg (173 lb), SpO2 100 %.   General: No acute distress, he is awake and alert, following commands  Cardiovascular: Regular rate and rhythm, normal S1S2  Respiratory: Normal inspiratory effort, lungs are clear to auscultation  Abdomen: Soft, nontender nondistended  Musculoskeletal: no edema       Discharge Instructions    Diet - low sodium heart healthy    Complete by:  As directed      Increase activity slowly    Complete by:  As directed              Signed: Mackayla Mullins 01/15/2015, 10:01 AM

## 2015-01-15 NOTE — Plan of Care (Signed)
Problem: Phase I Progression Outcomes Goal: Other Phase I Outcomes/Goals Outcome: Completed/Met Date Met:  01/15/15 Denies nausea, tolerated pm meal without problems

## 2015-05-19 ENCOUNTER — Encounter (HOSPITAL_COMMUNITY): Payer: Self-pay | Admitting: Emergency Medicine

## 2015-05-19 ENCOUNTER — Emergency Department (HOSPITAL_COMMUNITY)
Admission: EM | Admit: 2015-05-19 | Discharge: 2015-05-19 | Disposition: A | Discharge status: Home / Self Care | Payer: BLUE CROSS/BLUE SHIELD | Attending: Emergency Medicine | Admitting: Emergency Medicine

## 2015-05-19 DIAGNOSIS — R1084 Generalized abdominal pain: Secondary | ICD-10-CM | POA: Diagnosis not present

## 2015-05-19 DIAGNOSIS — Z79899 Other long term (current) drug therapy: Secondary | ICD-10-CM | POA: Diagnosis not present

## 2015-05-19 DIAGNOSIS — K219 Gastro-esophageal reflux disease without esophagitis: Secondary | ICD-10-CM | POA: Insufficient documentation

## 2015-05-19 DIAGNOSIS — F419 Anxiety disorder, unspecified: Secondary | ICD-10-CM | POA: Insufficient documentation

## 2015-05-19 DIAGNOSIS — R112 Nausea with vomiting, unspecified: Secondary | ICD-10-CM | POA: Insufficient documentation

## 2015-05-19 LAB — COMPREHENSIVE METABOLIC PANEL
ALBUMIN: 5.4 g/dL — AB (ref 3.5–5.0)
ALT: 22 U/L (ref 17–63)
AST: 26 U/L (ref 15–41)
Alkaline Phosphatase: 82 U/L (ref 38–126)
Anion gap: 19 — ABNORMAL HIGH (ref 5–15)
BUN: 20 mg/dL (ref 6–20)
CO2: 24 mmol/L (ref 22–32)
Calcium: 10.1 mg/dL (ref 8.9–10.3)
Chloride: 100 mmol/L — ABNORMAL LOW (ref 101–111)
Creatinine, Ser: 1.28 mg/dL — ABNORMAL HIGH (ref 0.61–1.24)
Glucose, Bld: 132 mg/dL — ABNORMAL HIGH (ref 65–99)
POTASSIUM: 3.5 mmol/L (ref 3.5–5.1)
Sodium: 143 mmol/L (ref 135–145)
Total Bilirubin: 1.7 mg/dL — ABNORMAL HIGH (ref 0.3–1.2)
Total Protein: 8.8 g/dL — ABNORMAL HIGH (ref 6.5–8.1)

## 2015-05-19 LAB — LIPASE, BLOOD: LIPASE: 28 U/L (ref 22–51)

## 2015-05-19 LAB — CBC WITH DIFFERENTIAL/PLATELET
BASOS PCT: 0 % (ref 0–1)
Basophils Absolute: 0 10*3/uL (ref 0.0–0.1)
EOS ABS: 0 10*3/uL (ref 0.0–0.7)
Eosinophils Relative: 0 % (ref 0–5)
HCT: 49.7 % (ref 39.0–52.0)
Hemoglobin: 17.4 g/dL — ABNORMAL HIGH (ref 13.0–17.0)
Lymphocytes Relative: 9 % — ABNORMAL LOW (ref 12–46)
Lymphs Abs: 0.9 10*3/uL (ref 0.7–4.0)
MCH: 30.3 pg (ref 26.0–34.0)
MCHC: 35 g/dL (ref 30.0–36.0)
MCV: 86.6 fL (ref 78.0–100.0)
Monocytes Absolute: 0.7 10*3/uL (ref 0.1–1.0)
Monocytes Relative: 7 % (ref 3–12)
NEUTROS PCT: 84 % — AB (ref 43–77)
Neutro Abs: 8 10*3/uL — ABNORMAL HIGH (ref 1.7–7.7)
PLATELETS: 256 10*3/uL (ref 150–400)
RBC: 5.74 MIL/uL (ref 4.22–5.81)
RDW: 12 % (ref 11.5–15.5)
WBC: 9.7 10*3/uL (ref 4.0–10.5)

## 2015-05-19 MED ORDER — PROMETHAZINE HCL 25 MG/ML IJ SOLN
25.0000 mg | Freq: Once | INTRAMUSCULAR | Status: AC
Start: 2015-05-19 — End: 2015-05-19
  Administered 2015-05-19: 25 mg via INTRAMUSCULAR
  Filled 2015-05-19: qty 1

## 2015-05-19 MED ORDER — ONDANSETRON HCL 4 MG PO TABS: 4.0000 mg | ORAL_TABLET | Freq: Four times a day (QID) | ORAL | Status: DC

## 2015-05-19 MED ORDER — HYDROMORPHONE HCL 1 MG/ML IJ SOLN
1.0000 mg | Freq: Once | INTRAMUSCULAR | Status: AC
  Administered 2015-05-19: 1 mg via INTRAVENOUS
  Filled 2015-05-19: qty 1

## 2015-05-19 MED ORDER — SODIUM CHLORIDE 0.9 % IV BOLUS (SEPSIS)
1000.0000 mL | Freq: Once | INTRAVENOUS | Status: AC
  Administered 2015-05-19: 1000 mL via INTRAVENOUS

## 2015-05-19 MED ORDER — ONDANSETRON HCL 4 MG/2ML IJ SOLN
4.0000 mg | Freq: Once | INTRAMUSCULAR | Status: AC
  Administered 2015-05-19: 4 mg via INTRAVENOUS
  Filled 2015-05-19: qty 2

## 2015-05-19 MED ORDER — METOCLOPRAMIDE HCL 5 MG/ML IJ SOLN
10.0000 mg | Freq: Once | INTRAMUSCULAR | Status: AC
  Administered 2015-05-19: 10 mg via INTRAVENOUS
  Filled 2015-05-19: qty 2

## 2015-05-19 MED ORDER — PANTOPRAZOLE SODIUM 40 MG PO TBEC: 40.0000 mg | DELAYED_RELEASE_TABLET | Freq: Every day | ORAL | Status: DC

## 2015-05-19 NOTE — ED Provider Notes (Signed)
CSN: [id number]     Arrival date & time 05/19/15  1542 History   First MD Initiated Contact with Patient 05/19/15 1612     Chief Complaint  Patient presents with  . Emesis     Patient is a 24 y.o. male presenting with vomiting. The history is provided by the patient. No language interpreter was used.  Emesis  Mr. Peter Suarez presents for evaluation of vomiting and abdominal pain. He started feeling poorly four days ago with nausea and occasional vomiting.  He developed significant diffuse, crampy abdominal pain starting today with dramatic increase in his vomiting.  He denies fevers, constipation, diarrhea, dysuria.  He as a hx/o cannabis hyperemesis syndrome, last smoked about a week ago.  This episode feels similar to his last episode in January.  Sxs are severe, constant, worsening.    Past Medical History  Diagnosis Date  . Anxiety   . GERD (gastroesophageal reflux disease)   . Nausea and vomiting     chronic, recurrent   Past Surgical History  Procedure Laterality Date  . Esophagogastroduodenoscopy  10/31/2014   Family History  Problem Relation Age of Onset  . High blood pressure Maternal Grandmother    History  Substance Use Topics  . Smoking status: Never Smoker   . Smokeless tobacco: Not on file  . Alcohol Use: No     Comment: Rarely    Review of Systems  Gastrointestinal: Positive for vomiting.  All other systems reviewed and are negative.     Allergies  Lorazepam  Home Medications   Prior to Admission medications   Medication Sig Start Date End Date Taking? Authorizing Provider  ALPRAZolam (XANAX) 0.25 MG tablet Take 0.25 mg by mouth 2 (two) times daily as needed for anxiety.    Historical Provider, MD  LORazepam (ATIVAN) 1 MG tablet One half to 1 tablet PO BID prn anxiety, nausea/vomiting Patient not taking: Reported on 01/11/2015 01/09/15   Samuel JesterKathleen McManus, DO  metoCLOPramide (REGLAN) 10 MG tablet Take 1 tablet (10 mg total) by mouth 3 (three) times daily  before meals. 01/15/15   Richarda OverlieNayana Abrol, MD  pantoprazole (PROTONIX) 40 MG tablet Take 1 tablet (40 mg total) by mouth 2 (two) times daily. 01/15/15   Richarda OverlieNayana Abrol, MD  promethazine (PHENERGAN) 25 MG suppository Place 1 suppository (25 mg total) rectally every 6 (six) hours as needed for nausea or vomiting. 01/15/15   Richarda OverlieNayana Abrol, MD  promethazine (PHENERGAN) 25 MG suppository Place 1 suppository (25 mg total) rectally every 8 (eight) hours as needed for nausea or vomiting. 01/15/15   Richarda OverlieNayana Abrol, MD  sucralfate (CARAFATE) 1 GM/10ML suspension Take 10 mLs (1 g total) by mouth 4 (four) times daily -  with meals and at bedtime. 01/15/15   Richarda OverlieNayana Abrol, MD   BP 99/68 mmHg  Pulse 99  Temp(Src) 98.4 F (36.9 C) (Oral)  Resp 16  SpO2 100% Physical Exam  Constitutional: He is oriented to person, place, and time. He appears well-developed and well-nourished.  Uncomfortable appearing  HENT:  Head: Normocephalic and atraumatic.  Cardiovascular: Normal rate and regular rhythm.   No murmur heard. Pulmonary/Chest: Effort normal and breath sounds normal. No respiratory distress.  Abdominal: Soft. There is no rebound and no guarding.  Moderate diffuse abdominal tenderness, greatest over the epigastrium.  Musculoskeletal: He exhibits no edema or tenderness.  Neurological: He is alert and oriented to person, place, and time.  Skin: Skin is warm and dry.  Psychiatric: He has a normal mood and affect.  His behavior is normal.  Nursing note and vitals reviewed.   ED Course  Procedures (including critical care time) Labs Review Labs Reviewed  CBC WITH DIFFERENTIAL/PLATELET - Abnormal; Notable for the following:    Hemoglobin 17.4 (*)    Neutrophils Relative % 84 (*)    Neutro Abs 8.0 (*)    Lymphocytes Relative 9 (*)    All other components within normal limits  COMPREHENSIVE METABOLIC PANEL - Abnormal; Notable for the following:    Chloride 100 (*)    Glucose, Bld 132 (*)    Creatinine, Ser 1.28 (*)     Total Protein 8.8 (*)    Albumin 5.4 (*)    Total Bilirubin 1.7 (*)    Anion gap 19 (*)    All other components within normal limits  LIPASE, BLOOD    Imaging Review No results found.   EKG Interpretation None      MDM   Final diagnoses:  Non-intractable vomiting with nausea, vomiting of unspecified type  Generalized abdominal pain    Patient here for vomiting and abdominal pain. He has a history of cannabis hyper emesis syndrome. On repeat evaluation after IV fluids and antiemetics patient is feeling improved. CBC and BMP are consistent with some dehydration. He is tolerating oral fluids in the department. History and presentation is not consistent with bowel obstruction, acute appendicitis, cholecystitis. Discussed home care for nausea and vomiting as well as close return precautions.    Tilden Fossa, MD 05/19/15 [phone]

## 2015-05-19 NOTE — ED Notes (Signed)
Per patient[facility]

## 2015-05-19 NOTE — ED Notes (Signed)
Pt able to ambulate well in the hall--- pt denies dizziness while ambulating.  Pt c/o "stomach still a little nauseous".  Pt was given ginger ale for fluid challenge earlier---- took few sips; pt did not vomit.

## 2015-05-19 NOTE — Discharge Instructions (Signed)
Abdominal Pain °Many things can cause abdominal pain. Usually, abdominal pain is not caused by a disease and will improve without treatment. It can often be observed and treated at home. Your health care provider will do a physical exam and possibly order blood tests and X-rays to help determine the seriousness of your pain. However, in many cases, more time must pass before a clear cause of the pain can be found. Before that point, your health care provider may not know if you need more testing or further treatment. °HOME CARE INSTRUCTIONS  °Monitor your abdominal pain for any changes. The following actions may help to alleviate any discomfort you are experiencing: °· Only take over-the-counter or prescription medicines as directed by your health care provider. °· Do not take laxatives unless directed to do so by your health care provider. °· Try a clear liquid diet (broth, tea, or water) as directed by your health care provider. Slowly move to a bland diet as tolerated. °SEEK MEDICAL CARE IF: °1. You have unexplained abdominal pain. °2. You have abdominal pain associated with nausea or diarrhea. °3. You have pain when you urinate or have a bowel movement. °4. You experience abdominal pain that wakes you in the night. °5. You have abdominal pain that is worsened or improved by eating food. °6. You have abdominal pain that is worsened with eating fatty foods. °7. You have a fever. °SEEK IMMEDIATE MEDICAL CARE IF:  °· Your pain does not go away within 2 hours. °· You keep throwing up (vomiting). °· Your pain is felt only in portions of the abdomen, such as the right side or the left lower portion of the abdomen. °· You pass bloody or black tarry stools. °MAKE SURE YOU: °· Understand these instructions.   °· Will watch your condition.   °· Will get help right away if you are not doing well or get worse.   °Document Released: 09/23/2005 Document Revised: 12/19/2013 Document Reviewed: 08/23/2013 °ExitCare® Patient  Information ©2015 [employer]. This information is not intended to replace advice given to you by your health care provider. Make sure you discuss any questions you have with your health care provider. ° °Cyclic Vomiting Syndrome °Cyclic vomiting syndrome is a benign condition in which patients experience bouts or cycles of severe nausea and vomiting that last for hours or even days. The bouts of nausea and vomiting alternate with longer periods of no symptoms and generally good health. Cyclic vomiting syndrome occurs mostly in children, but can affect adults. °CAUSES  °[facility] has no known cause. Each episode is typically similar to the previous ones. The episodes tend to:  °· Start at about the same time of day. °· Last the same length of time. °· Present the same symptoms at the same level of intensity. °Cyclic vomiting syndrome can begin at any age in children and adults. Cyclic vomiting syndrome usually starts between the ages of 3 and 7 years. In adults, episodes tend to occur less often than they do in children, but they last longer. Furthermore, the events or situations that trigger episodes in adults cannot always be pinpointed as easily as they can in children. °There are 4 phases of cyclic vomiting syndrome: °8. Prodrome. The prodrome phase signals that an episode of nausea and vomiting is about to begin. This phase can last from just a few minutes to several hours. This phase is often marked by belly (abdominal) pain. Sometimes taking medicine early in the prodrome phase can stop an episode in progress. However,   sometimes there is no warning. A person may simply wake up in the middle of the night or early morning and begin vomiting. °9. Episode. The episode phase consists of: °· Severe vomiting. °· Nausea. °· Gagging (retching). °10. Recovery. The recovery phase begins when the nausea and vomiting stop. Healthy color, appetite, and energy return. °11. Symptom-free interval. The symptom-free interval phase  is the period between episodes when no symptoms are present. °TRIGGERS °Episodes can be triggered by an infection or event. Examples of triggers include: °· Infections. °· Colds, allergies, sinus problems, and the flu. °· Eating certain foods such as chocolate or cheese. °· Foods with monosodium glutamate (MSG) or preservatives. °· Fast foods. °· Pre-packaged foods. °· Foods with low nutritional value (junk foods). °· Overeating. °· Eating just before going to bed. °· Hot weather. °· Dehydration. °· Not enough sleep or poor sleep quality. °· Physical exhaustion. °· Menstruation. °· Motion sickness. °· Emotional stress (school or home difficulties). °· Excitement or stress. °SYMPTOMS  °The main symptoms of cyclic vomiting syndrome are: °· Severe vomiting. °· Nausea. °· Gagging (retching). °Episodes usually begin at night or the first thing in the morning. Episodes may include vomiting or retching up to 5 or 6 times an hour during the worst of the episode. Episodes usually last anywhere from 1 to 4 days. Episodes can last for up to 10 days. Other symptoms include: °· Paleness. °· Exhaustion. °· Listlessness. °· Abdominal pain. °· Loose stools or diarrhea. °Sometimes the nausea and vomiting are so severe that a person appears to be almost unconscious. Sensitivity to light, headache, fever, dizziness, may also accompany an episode. In addition, the vomiting may cause drooling and excessive thirst. Drinking water usually leads to more vomiting, though the water can dilute the acid in the vomit, making the episode a little less painful. Continuous vomiting can lead to dehydration, which means that the body has lost excessive water and salts. °DIAGNOSIS  °Cyclic vomiting syndrome is hard to diagnose because there are no clear tests to identify it. A caregiver must diagnose cyclic vomiting syndrome by looking at symptoms and medical history. A caregiver must exclude more common diseases or disorders that can also cause  nausea and vomiting. Also, diagnosis takes time because caregivers need to identify a pattern or cycle to the vomiting. °TREATMENT  °Cyclic vomiting syndrome cannot be cured. Treatment varies, but people with cyclic vomiting syndrome should get plenty of rest and sleep and take medications that prevent, stop, or lessen the vomiting episodes and other symptoms. °People whose episodes are frequent and long-lasting may be treated during the symptom-free intervals in an effort to prevent or ease future episodes. The symptom-free phase is a good time to eliminate anything known to trigger an episode. For example, if episodes are brought on by stress or excitement, this period is the time to find ways to reduce stress and stay calm. If sinus problems or allergies cause episodes, those conditions should be treated. The triggers listed above should be avoided or prevented. °Because of the similarities between migraine and cyclic vomiting syndrome, caregivers treat some people with severe cyclic vomiting syndrome with drugs that are also used for migraine headaches. The drugs are designed to: °· Prevent episodes. °· Reduce their frequency. °· Lessen their severity. °HOME CARE INSTRUCTIONS °Once a vomiting episode begins, treatment is supportive. It helps to stay in bed and sleep in a dark, quiet room. Severe nausea and vomiting may require hospitalization and intravenous (IV) fluids to prevent dehydration.   Relaxing medications (sedatives) may help if the nausea continues. Sometimes, during the prodrome phase, it is possible to stop an episode from happening altogether. Only take over-the-counter or prescription medicines for pain, discomfort or fever as directed by your caregiver. Do not give aspirin to children. °During the recovery phase, drinking water and replacing lost electrolytes (salts in the blood) are very important. Electrolytes are salts that the body needs to function well and stay healthy. Symptoms during the  recovery phase can vary. Some people find that their appetites return to normal immediately, while others need to begin by drinking clear liquids and then move slowly to solid food. °RELATED COMPLICATIONS °The severe vomiting that defines cyclic vomiting syndrome is a risk factor for several complications: °· Dehydration--Vomiting causes the body to lose water quickly. °· Electrolyte imbalance--Vomiting also causes the body to lose the important salts it needs to keep working properly. °· Peptic esophagitis--The tube that connects the mouth to the stomach (esophagus) becomes injured from the stomach acid that comes up with the vomit. °· Hematemesis--The esophagus becomes irritated and bleeds, so blood mixes with the vomit. °· Mallory-Weiss tear--The lower end of the esophagus may tear open or the stomach may bruise from vomiting or retching. °· Tooth decay--The acid in the vomit can hurt the teeth by corroding the tooth enamel. °SEEK MEDICAL CARE IF: °You have questions or problems. °Document Released: 02/22/2002 Document Revised: 03/07/2012 Document Reviewed: 03/23/2011 °ExitCare® Patient Information ©2015 [employer]. This information is not intended to replace advice given to you by your health care provider. Make sure you discuss any questions you have with your health care provider. ° °

## 2015-11-29 ENCOUNTER — Emergency Department (HOSPITAL_COMMUNITY)
Admission: EM | Admit: 2015-11-29 | Discharge: 2015-11-29 | Disposition: A | Discharge status: Home / Self Care | Payer: BLUE CROSS/BLUE SHIELD | Attending: Emergency Medicine | Admitting: Emergency Medicine

## 2015-11-29 DIAGNOSIS — K219 Gastro-esophageal reflux disease without esophagitis: Secondary | ICD-10-CM | POA: Diagnosis not present

## 2015-11-29 DIAGNOSIS — F1210 Cannabis abuse, uncomplicated: Secondary | ICD-10-CM | POA: Insufficient documentation

## 2015-11-29 DIAGNOSIS — R197 Diarrhea, unspecified: Secondary | ICD-10-CM | POA: Diagnosis not present

## 2015-11-29 DIAGNOSIS — Z8659 Personal history of other mental and behavioral disorders: Secondary | ICD-10-CM | POA: Diagnosis not present

## 2015-11-29 DIAGNOSIS — R1084 Generalized abdominal pain: Secondary | ICD-10-CM | POA: Insufficient documentation

## 2015-11-29 DIAGNOSIS — R1110 Vomiting, unspecified: Secondary | ICD-10-CM

## 2015-11-29 DIAGNOSIS — R112 Nausea with vomiting, unspecified: Secondary | ICD-10-CM | POA: Diagnosis present

## 2015-11-29 LAB — COMPREHENSIVE METABOLIC PANEL
ALT: 37 U/L (ref 17–63)
AST: 29 U/L (ref 15–41)
Albumin: 5.6 g/dL — ABNORMAL HIGH (ref 3.5–5.0)
Alkaline Phosphatase: 109 U/L (ref 38–126)
Anion gap: 16 — ABNORMAL HIGH (ref 5–15)
BUN: 29 mg/dL — AB (ref 6–20)
CHLORIDE: 101 mmol/L (ref 101–111)
CO2: 24 mmol/L (ref 22–32)
CREATININE: 1.31 mg/dL — AB (ref 0.61–1.24)
Calcium: 10.1 mg/dL (ref 8.9–10.3)
GFR calc Af Amer: >60 mL/min (ref >60)
Glucose, Bld: 138 mg/dL — ABNORMAL HIGH (ref 65–99)
Potassium: 3.3 mmol/L — ABNORMAL LOW (ref 3.5–5.1)
Sodium: 141 mmol/L (ref 135–145)
Total Bilirubin: 1.9 mg/dL — ABNORMAL HIGH (ref 0.3–1.2)
Total Protein: 9 g/dL — ABNORMAL HIGH (ref 6.5–8.1)

## 2015-11-29 LAB — LIPASE, BLOOD: LIPASE: 40 U/L (ref 11–51)

## 2015-11-29 LAB — CBC
HCT: 47.4 % (ref 39.0–52.0)
Hemoglobin: 16.5 g/dL (ref 13.0–17.0)
MCH: 30.6 pg (ref 26.0–34.0)
MCHC: 34.8 g/dL (ref 30.0–36.0)
MCV: 87.9 fL (ref 78.0–100.0)
PLATELETS: 241 10*3/uL (ref 150–400)
RBC: 5.39 MIL/uL (ref 4.22–5.81)
RDW: 12.2 % (ref 11.5–15.5)
WBC: 10.7 10*3/uL — AB (ref 4.0–10.5)

## 2015-11-29 LAB — URINE MICROSCOPIC-ADD ON: RBC / HPF: NONE SEEN RBC/hpf (ref 0–5)

## 2015-11-29 LAB — URINALYSIS, ROUTINE W REFLEX MICROSCOPIC
GLUCOSE, UA: NEGATIVE mg/dL
HGB URINE DIPSTICK: NEGATIVE
Nitrite: NEGATIVE
PROTEIN: 100 mg/dL — AB
Specific Gravity, Urine: 1.039 — ABNORMAL HIGH (ref 1.005–1.030)
pH: 6 (ref 5.0–8.0)

## 2015-11-29 MED ORDER — METOCLOPRAMIDE HCL 5 MG/ML IJ SOLN
10.0000 mg | Freq: Once | INTRAMUSCULAR | Status: AC
  Administered 2015-11-29: 10 mg via INTRAVENOUS
  Filled 2015-11-29: qty 2

## 2015-11-29 MED ORDER — ONDANSETRON HCL 4 MG/2ML IJ SOLN
4.0000 mg | Freq: Once | INTRAMUSCULAR | Status: AC
  Administered 2015-11-29: 4 mg via INTRAVENOUS
  Filled 2015-11-29: qty 2

## 2015-11-29 MED ORDER — SODIUM CHLORIDE 0.9 % IV SOLN
1000.0000 mL | Freq: Once | INTRAVENOUS | Status: AC
  Administered 2015-11-29: 1000 mL via INTRAVENOUS

## 2015-11-29 MED ORDER — DIPHENHYDRAMINE HCL 50 MG/ML IJ SOLN
25.0000 mg | Freq: Once | INTRAMUSCULAR | Status: AC
  Administered 2015-11-29: 25 mg via INTRAVENOUS
  Filled 2015-11-29: qty 1

## 2015-11-29 MED ORDER — SODIUM CHLORIDE 0.9 % IV SOLN
1000.0000 mL | INTRAVENOUS | Status: DC
  Administered 2015-11-29: 1000 mL via INTRAVENOUS

## 2015-11-29 MED ORDER — PANTOPRAZOLE SODIUM 20 MG PO TBEC: 20.0000 mg | DELAYED_RELEASE_TABLET | Freq: Every day | ORAL | Status: DC

## 2015-11-29 MED ORDER — METOCLOPRAMIDE HCL 10 MG PO TABS: 10.0000 mg | ORAL_TABLET | Freq: Four times a day (QID) | ORAL | Status: DC

## 2015-11-29 MED ORDER — PANTOPRAZOLE SODIUM 40 MG IV SOLR
40.0000 mg | Freq: Once | INTRAVENOUS | Status: AC
  Administered 2015-11-29: 40 mg via INTRAVENOUS
  Filled 2015-11-29: qty 40

## 2015-11-29 MED ORDER — ONDANSETRON 4 MG PO TBDP: 4.0000 mg | ORAL_TABLET | ORAL | Status: DC | PRN

## 2015-11-29 NOTE — Discharge Instructions (Signed)
Cannabis Use Disorder °Cannabis use disorder is a mental disorder. It is not one-time or occasional use of cannabis, more commonly known as marijuana. Cannabis use disorder is the continued, nonmedical use of cannabis that interferes with normal life activities or causes health problems. People with cannabis use disorder get a feeling of extreme pleasure and relaxation from cannabis use. This "high" is very rewarding and causes people to use over and over.  °The mind-altering ingredient in cannabis is know as THC. THC can also interfere with motor coordination, memory, judgment, and accurate sense of space and time. These effects can last for a few days after using cannabis. Regular heavy cannabis use can cause long-lasting problems with thinking and learning. In young people, these problems may be permanent. Cannabis sometimes causes severe anxiety, paranoia, or visual hallucinations. Man-made (synthetic) cannabis-like drugs, such as "spice" and "K2," cause the same effects as THC but are much stronger. Cannabis-like drugs can cause dangerously high blood pressure and heart rate.  °Cannabis use disorder usually starts in the teenage years. It can trigger the development of schizophrenia. It is somewhat more common in men than women. People who have family members with the disorder or existing mental health issues such as depression and posttraumatic stress disorder are more likely to develop cannabis use disorder. People with cannabis use disorder are at higher risk for use of other drugs of abuse.  °SIGNS AND SYMPTOMS °Signs and symptoms of cannabis use disorder include:  °· Use of cannabis in larger amounts or over a longer period than intended.   °· Unsuccessful attempts to cut down or control cannabis use.   °· A lot of time spent obtaining, using, or recovering from the effects of cannabis.   °· A strong desire or urge to use cannabis (cravings).   °· Continued use of cannabis in spite of problems at work,  school, or home because of use.   °· Continued use of cannabis in spite of relationship problems because of use. °· Giving up or cutting down on important life activities because of cannabis use. °· Use of cannabis over and over even in situations when it is physically hazardous, such as when driving a car.   °· Continued use of cannabis in spite of a physical problem that is likely related to use. Physical problems can include: °· Chronic cough. °· Bronchitis. °· Emphysema. °· Throat and lung cancer. °· Continued use of cannabis in spite of a mental problem that is likely related to use. Mental problems can include: °· Psychosis. °· Anxiety. °· Difficulty sleeping. °· Need to use more and more cannabis to get the same effect, or lessened effect over time with use of the same amount (tolerance). °· Having withdrawal symptoms when cannabis use is stopped, or using cannabis to reduce or avoid withdrawal symptoms. Withdrawal symptoms include: °· Irritability or anger. °· Anxiety or restlessness. °· Difficulty sleeping. °· Loss of appetite or weight. °· Aches and pains. °· Shakiness. °· Sweating. °· Chills. °DIAGNOSIS °Cannabis use disorder is diagnosed by your health care provider. You may be asked questions about your cannabis use and how it affects your life. A physical exam may be done. A drug screen may be done. You may be referred to a mental health professional. The diagnosis of cannabis use disorder requires at least two symptoms within 12 months. The type of cannabis use disorder you have depends on the number of symptoms you have. The type may be: °· Mild. Two or three signs and symptoms.   °· Moderate. Four or   five signs and symptoms.   °· Severe. Six or more signs and symptoms.   °TREATMENT °Treatment is usually provided by mental health professionals with training in substance use disorders. The following options are available: °· Counseling or talk therapy. Talk therapy addresses the reasons you use  cannabis. It also addresses ways to keep you from using again. The goals of talk therapy include: °¨ Identifying and avoiding triggers for use. °¨ Learning how to handle cravings. °¨ Replacing use with healthy activities. °· Support groups. Support groups provide emotional support, advice, and guidance. °· Medicine. Medicine is used to treat mental health issues that trigger cannabis use or that result from it. °HOME CARE INSTRUCTIONS °· Take medicines only as directed by your health care provider. °· Check with your health care provider before starting any new medicines. °· Keep all follow-up visits as directed by your health care provider. °SEEK MEDICAL CARE IF: °· You are not able to take your medicines as directed. °· Your symptoms get worse. °SEEK IMMEDIATE MEDICAL CARE IF: °You have serious thoughts about hurting yourself or others. °FOR MORE INFORMATION °· National Institute on Drug Abuse: www.drugabuse.gov °· Substance Abuse and Mental Health Services Administration: www.samhsa.gov °  °This information is not intended to replace advice given to you by your health care provider. Make sure you discuss any questions you have with your health care provider. °  °Document Released: 12/11/2000 Document Revised: 01/04/2015 Document Reviewed: 12/27/2013 °Elsevier Interactive Patient Education ©2016 [employer] °Nausea and Vomiting °Nausea is a sick feeling that often comes before throwing up (vomiting). Vomiting is a reflex where stomach contents come out of your mouth. Vomiting can cause severe loss of body fluids (dehydration). Children and elderly adults can become dehydrated quickly, especially if they also have diarrhea. Nausea and vomiting are symptoms of a condition or disease. It is important to find the cause of your symptoms. °CAUSES  °· Direct irritation of the stomach lining. This irritation can result from increased acid production (gastroesophageal reflux disease), infection, food poisoning, taking  certain medicines (such as nonsteroidal anti-inflammatory drugs), alcohol use, or tobacco use. °· Signals from the brain. These signals could be caused by a headache, heat exposure, an inner ear disturbance, increased pressure in the brain from injury, infection, a tumor, or a concussion, pain, emotional stimulus, or metabolic problems. °· An obstruction in the gastrointestinal tract (bowel obstruction). °· Illnesses such as diabetes, hepatitis, gallbladder problems, appendicitis, kidney problems, cancer, sepsis, atypical symptoms of a heart attack, or eating disorders. °· Medical treatments such as chemotherapy and radiation. °· Receiving medicine that makes you sleep (general anesthetic) during surgery. °DIAGNOSIS °Your caregiver may ask for tests to be done if the problems do not improve after a few days. Tests may also be done if symptoms are severe or if the reason for the nausea and vomiting is not clear. Tests may include: °· Urine tests. °· Blood tests. °· Stool tests. °· Cultures (to look for evidence of infection). °· X-rays or other imaging studies. °Test results can help your caregiver make decisions about treatment or the need for additional tests. °TREATMENT °You need to stay well hydrated. Drink frequently but in small amounts. You may wish to drink water, sports drinks, clear broth, or eat frozen ice pops or gelatin dessert to help stay hydrated. When you eat, eating slowly may help prevent nausea. There are also some antinausea medicines that may help prevent nausea. °HOME CARE INSTRUCTIONS  °· Take all medicine as directed by your caregiver. °· If you do not   have an appetite, do not force yourself to eat. However, you must continue to drink fluids. °· If you have an appetite, eat a normal diet unless your caregiver tells you differently. °¨ Eat a variety of complex carbohydrates (rice, wheat, potatoes, bread), lean meats, yogurt, fruits, and vegetables. °¨ Avoid high-fat foods because they are more  difficult to digest. °· Drink enough water and fluids to keep your urine clear or pale yellow. °· If you are dehydrated, ask your caregiver for specific rehydration instructions. Signs of dehydration may include: °¨ Severe thirst. °¨ Dry lips and mouth. °¨ Dizziness. °¨ Dark urine. °¨ Decreasing urine frequency and amount. °¨ Confusion. °¨ Rapid breathing or pulse. °SEEK IMMEDIATE MEDICAL CARE IF:  °· You have blood or brown flecks (like coffee grounds) in your vomit. °· You have black or bloody stools. °· You have a severe headache or stiff neck. °· You are confused. °· You have severe abdominal pain. °· You have chest pain or trouble breathing. °· You do not urinate at least once every 8 hours. °· You develop cold or clammy skin. °· You continue to vomit for longer than 24 to 48 hours. °· You have a fever. °MAKE SURE YOU:  °· Understand these instructions. °· Will watch your condition. °· Will get help right away if you are not doing well or get worse. °  °This information is not intended to replace advice given to you by your health care provider. Make sure you discuss any questions you have with your health care provider. °  °Document Released: 12/14/2005 Document Revised: 03/07/2012 Document Reviewed: 05/13/2011 °Elsevier Interactive Patient Education ©2016 [employer] ° °

## 2015-11-29 NOTE — ED Provider Notes (Signed)
CSN: [id number]     Arrival date & time 11/29/15  0744 History   First MD Initiated Contact with Patient 11/29/15 0802     Chief Complaint  Patient presents with  . Abdominal Pain  . Emesis     (Consider location/radiation/quality/duration/timing/severity/associated sxs/prior Treatment) HPI Patient poor he's had vomiting for 4 days. He states he is getting dehydrated. He describes generalized cramping abdominal pain. He has had only one episode of diarrhea. Patient states he is sure this is due to marijuana use. He reports this has happened before he reports he started smoking again about 2 weeks ago. Patient reports that after last episode he was treated with Protonix and Reglan with good result. He however has not had these medications since the last episode of hyperemesis. No fever or chills. Past Medical History  Diagnosis Date  . Anxiety   . GERD (gastroesophageal reflux disease)   . Nausea and vomiting     chronic, recurrent   Past Surgical History  Procedure Laterality Date  . Esophagogastroduodenoscopy  10/31/2014   Family History  Problem Relation Age of Onset  . High blood pressure Maternal Grandmother    Social History  Substance Use Topics  . Smoking status: Never Smoker   . Smokeless tobacco: Not on file  . Alcohol Use: No     Comment: Rarely    Review of Systems  10 Systems reviewed and are negative for acute change except as noted in the HPI.   Allergies  Lorazepam  Home Medications   Prior to Admission medications   Medication Sig Start Date End Date Taking? Authorizing Provider  metoCLOPramide (REGLAN) 10 MG tablet Take 1 tablet (10 mg total) by mouth 3 (three) times daily before meals. Patient taking differently: Take 10 mg by mouth every 6 (six) hours as needed for nausea or vomiting.  01/15/15  Yes Richarda OverlieNayana Abrol, MD  pantoprazole (PROTONIX) 40 MG tablet Take 1 tablet (40 mg total) by mouth daily. Patient taking differently: Take 40 mg by mouth  daily as needed (heartburn).  05/19/15  Yes Tilden FossaElizabeth Rees, MD  metoCLOPramide (REGLAN) 10 MG tablet Take 1 tablet (10 mg total) by mouth every 6 (six) hours. 11/29/15   Arby BarretteMarcy Keijuan Schellhase, MD  ondansetron (ZOFRAN ODT) 4 MG disintegrating tablet Take 1 tablet (4 mg total) by mouth every 4 (four) hours as needed for nausea or vomiting. 11/29/15   Arby BarretteMarcy Francena Zender, MD  pantoprazole (PROTONIX) 20 MG tablet Take 1 tablet (20 mg total) by mouth daily. 11/29/15   Arby BarretteMarcy Innocence Schlotzhauer, MD   BP 135/85 mmHg  Pulse 88  Temp(Src) 98 F (36.7 C) (Oral)  Resp 18  SpO2 99% Physical Exam  Constitutional: He is oriented to person, place, and time. He appears well-developed and well-nourished.  Patient is well-nourished and well-developed. General physical condition is good. Patient appears uncomfortable.  HENT:  Head: Normocephalic and atraumatic.  Eyes: EOM are normal. Pupils are equal, round, and reactive to light.  Neck: Neck supple.  Cardiovascular: Normal rate, regular rhythm, normal heart sounds and intact distal pulses.   Pulmonary/Chest: Effort normal and breath sounds normal.  Abdominal: Soft. Bowel sounds are normal. He exhibits no distension. There is tenderness.  Diffuse mild abdominal tenderness. Abdomen soft.  Musculoskeletal: Normal range of motion. He exhibits no edema or tenderness.  Neurological: He is alert and oriented to person, place, and time. He has normal strength. Coordination normal. GCS eye subscore is 4. GCS verbal subscore is 5. GCS motor subscore is 6.  Skin: Skin is warm, dry and intact.  Psychiatric: He has a normal mood and affect.    ED Course  Procedures (including critical care time) Labs Review Labs Reviewed  COMPREHENSIVE METABOLIC PANEL - Abnormal; Notable for the following:    Potassium 3.3 (*)    Glucose, Bld 138 (*)    BUN 29 (*)    Creatinine, Ser 1.31 (*)    Total Protein 9.0 (*)    Albumin 5.6 (*)    Total Bilirubin 1.9 (*)    Anion gap 16 (*)    All other  components within normal limits  CBC - Abnormal; Notable for the following:    WBC 10.7 (*)    All other components within normal limits  URINALYSIS, ROUTINE W REFLEX MICROSCOPIC (NOT AT [facility]) - Abnormal; Notable for the following:    Color, Urine ORANGE (*)    APPearance CLOUDY (*)    Specific Gravity, Urine 1.039 (*)    Bilirubin Urine MODERATE (*)    Ketones, ur >80 (*)    Protein, ur 100 (*)    Leukocytes, UA TRACE (*)    All other components within normal limits  URINE MICROSCOPIC-ADD ON - Abnormal; Notable for the following:    Squamous Epithelial / LPF 0-5 (*)    Bacteria, UA FEW (*)    All other components within normal limits  LIPASE, BLOOD    Imaging Review No results found. I have personally reviewed and evaluated these images and lab results as part of my medical decision-making.   EKG Interpretation None      MDM   Final diagnoses:  Intractable vomiting with nausea, vomiting of unspecified type  Marijuana abuse   Patient is improved treatment. At this time vital signs are stable and he will be provided with Reglan, Protonix and Zofran for symptomatic control. Patient is aware of complications of hyperemesis with marijuana use. He is advised to follow-up with his family physician for ongoing management.    Arby Barrette, MD 11/29/15 [phone]

## 2015-11-29 NOTE — ED Notes (Signed)
Pt with n/v/d and abdominal pain x 4 days.  Pt can't keep anything down.  Feels dehydrated.

## 2015-12-03 ENCOUNTER — Emergency Department (HOSPITAL_COMMUNITY)
Admission: EM | Admit: 2015-12-03 | Discharge: 2015-12-03 | Disposition: A | Discharge status: Home / Self Care | Payer: BLUE CROSS/BLUE SHIELD | Attending: Emergency Medicine | Admitting: Emergency Medicine

## 2015-12-03 ENCOUNTER — Encounter (HOSPITAL_COMMUNITY): Payer: Self-pay | Admitting: Emergency Medicine

## 2015-12-03 DIAGNOSIS — Z8659 Personal history of other mental and behavioral disorders: Secondary | ICD-10-CM | POA: Diagnosis not present

## 2015-12-03 DIAGNOSIS — G43A0 Cyclical vomiting, not intractable: Secondary | ICD-10-CM | POA: Insufficient documentation

## 2015-12-03 DIAGNOSIS — R112 Nausea with vomiting, unspecified: Secondary | ICD-10-CM | POA: Diagnosis present

## 2015-12-03 DIAGNOSIS — R1084 Generalized abdominal pain: Secondary | ICD-10-CM | POA: Diagnosis not present

## 2015-12-03 DIAGNOSIS — R1115 Cyclical vomiting syndrome unrelated to migraine: Secondary | ICD-10-CM

## 2015-12-03 DIAGNOSIS — K219 Gastro-esophageal reflux disease without esophagitis: Secondary | ICD-10-CM | POA: Diagnosis not present

## 2015-12-03 DIAGNOSIS — Z79899 Other long term (current) drug therapy: Secondary | ICD-10-CM | POA: Insufficient documentation

## 2015-12-03 LAB — CBC WITH DIFFERENTIAL/PLATELET
BASOS PCT: 0 %
Basophils Absolute: 0 10*3/uL (ref 0.0–0.1)
EOS ABS: 0 10*3/uL (ref 0.0–0.7)
EOS PCT: 0 %
HCT: 46.1 % (ref 39.0–52.0)
HEMOGLOBIN: 16.2 g/dL (ref 13.0–17.0)
LYMPHS ABS: 1.1 10*3/uL (ref 0.7–4.0)
Lymphocytes Relative: 16 %
MCH: 30.5 pg (ref 26.0–34.0)
MCHC: 35.1 g/dL (ref 30.0–36.0)
MCV: 86.7 fL (ref 78.0–100.0)
Monocytes Absolute: 0.8 10*3/uL (ref 0.1–1.0)
Monocytes Relative: 11 %
NEUTROS PCT: 73 %
Neutro Abs: 4.9 10*3/uL (ref 1.7–7.7)
PLATELETS: 216 10*3/uL (ref 150–400)
RBC: 5.32 MIL/uL (ref 4.22–5.81)
RDW: 11.9 % (ref 11.5–15.5)
WBC: 6.8 10*3/uL (ref 4.0–10.5)

## 2015-12-03 LAB — COMPREHENSIVE METABOLIC PANEL
ALBUMIN: 5.1 g/dL — AB (ref 3.5–5.0)
ALK PHOS: 94 U/L (ref 38–126)
ALT: 18 U/L (ref 17–63)
ANION GAP: 13 (ref 5–15)
AST: 17 U/L (ref 15–41)
BILIRUBIN TOTAL: 1 mg/dL (ref 0.3–1.2)
BUN: 17 mg/dL (ref 6–20)
CALCIUM: 9.8 mg/dL (ref 8.9–10.3)
CO2: 25 mmol/L (ref 22–32)
Chloride: 103 mmol/L (ref 101–111)
Creatinine, Ser: 0.98 mg/dL (ref 0.61–1.24)
GFR calc Af Amer: >60 mL/min (ref >60)
GFR calc non Af Amer: >60 mL/min (ref >60)
GLUCOSE: 113 mg/dL — AB (ref 65–99)
Potassium: 3.4 mmol/L — ABNORMAL LOW (ref 3.5–5.1)
SODIUM: 141 mmol/L (ref 135–145)
TOTAL PROTEIN: 8.2 g/dL — AB (ref 6.5–8.1)

## 2015-12-03 LAB — I-STAT CG4 LACTIC ACID, ED: Lactic Acid, Venous: 1.05 mmol/L (ref 0.5–2.0)

## 2015-12-03 LAB — LIPASE, BLOOD: Lipase: 51 U/L (ref 11–51)

## 2015-12-03 MED ORDER — SODIUM CHLORIDE 0.9 % IV BOLUS (SEPSIS)
1000.0000 mL | Freq: Once | INTRAVENOUS | Status: AC
  Administered 2015-12-03: 1000 mL via INTRAVENOUS

## 2015-12-03 MED ORDER — ALUM & MAG HYDROXIDE-SIMETH 200-200-20 MG/5ML PO SUSP
15.0000 mL | Freq: Once | ORAL | Status: AC
  Administered 2015-12-03: 15 mL via ORAL
  Filled 2015-12-03: qty 30

## 2015-12-03 MED ORDER — HALOPERIDOL LACTATE 5 MG/ML IJ SOLN
5.0000 mg | Freq: Once | INTRAMUSCULAR | Status: AC
  Administered 2015-12-03: 5 mg via INTRAVENOUS
  Filled 2015-12-03: qty 1

## 2015-12-03 NOTE — ED Notes (Addendum)
Pt reports vomiting/nausea since last Monday. Was seen in the ED recently (Friday) for similar s/s but was discharged. Diagnosed with cannabinoid hyperemesis in January and hospitalized. No active vomiting in triage. Patient appears dehydrated with dry lips. Girlfriend reports there were specks of "coffee ground stuff" in his vomit Saturday. No other c/c. Allergic to Ativan. Has been taking Zofran with no relief.

## 2015-12-03 NOTE — Discharge Instructions (Signed)

## 2015-12-03 NOTE — ED Provider Notes (Signed)
CSN: [id number]     Arrival date & time 12/03/15  1945 History   First MD Initiated Contact with Patient 12/03/15 1956     Chief Complaint  Patient presents with  . Dehydration  . Emesis  . Nausea     (Consider location/radiation/quality/duration/timing/severity/associated sxs/prior Treatment) Patient is a 24 y.o. male presenting with vomiting. The history is provided by the patient and a parent.  Emesis Severity:  Moderate Duration:  2 days Timing:  Constant Quality:  Stomach contents Progression:  Worsening Chronicity:  Recurrent Recent urination:  Normal Relieved by:  Nothing Worsened by:  Nothing tried Ineffective treatments: home reglan and zofran. Associated symptoms: abdominal pain (diffuse with vomiting)   Associated symptoms: no fever   Risk factors comment:  Marijuana use   Past Medical History  Diagnosis Date  . Anxiety   . GERD (gastroesophageal reflux disease)   . Nausea and vomiting     chronic, recurrent   Past Surgical History  Procedure Laterality Date  . Esophagogastroduodenoscopy  10/31/2014   Family History  Problem Relation Age of Onset  . High blood pressure Maternal Grandmother    Social History  Substance Use Topics  . Smoking status: Never Smoker   . Smokeless tobacco: None  . Alcohol Use: No     Comment: Rarely    Review of Systems  Gastrointestinal: Positive for vomiting and abdominal pain (diffuse with vomiting).  All other systems reviewed and are negative.     Allergies  Lorazepam  Home Medications   Prior to Admission medications   Medication Sig Start Date End Date Taking? Authorizing Provider  metoCLOPramide (REGLAN) 10 MG tablet Take 1 tablet (10 mg total) by mouth 3 (three) times daily before meals. Patient taking differently: Take 10 mg by mouth every 6 (six) hours as needed for nausea or vomiting.  01/15/15   Richarda OverlieNayana Abrol, MD  metoCLOPramide (REGLAN) 10 MG tablet Take 1 tablet (10 mg total) by mouth every 6 (six)  hours. 11/29/15   Arby BarretteMarcy Pfeiffer, MD  ondansetron (ZOFRAN ODT) 4 MG disintegrating tablet Take 1 tablet (4 mg total) by mouth every 4 (four) hours as needed for nausea or vomiting. 11/29/15   Arby BarretteMarcy Pfeiffer, MD  pantoprazole (PROTONIX) 20 MG tablet Take 1 tablet (20 mg total) by mouth daily. 11/29/15   Arby BarretteMarcy Pfeiffer, MD  pantoprazole (PROTONIX) 40 MG tablet Take 1 tablet (40 mg total) by mouth daily. Patient taking differently: Take 40 mg by mouth daily as needed (heartburn).  05/19/15   Tilden FossaElizabeth Rees, MD   BP 120/78 mmHg  Pulse 78  Temp(Src) 97.6 F (36.4 C) (Oral)  Resp 18  SpO2 98% Physical Exam  Constitutional: He is oriented to person, place, and time. He appears well-developed and well-nourished. No distress.  HENT:  Head: Normocephalic and atraumatic.  Eyes: Conjunctivae are normal.  Neck: Neck supple. No tracheal deviation present.  Cardiovascular: Normal rate, regular rhythm and normal heart sounds.   Pulmonary/Chest: Effort normal and breath sounds normal. No respiratory distress.  Abdominal: Soft. He exhibits no distension. There is tenderness (diffuse mild, non-focal).  Neurological: He is alert and oriented to person, place, and time.  Skin: Skin is warm and dry.  Psychiatric: He has a normal mood and affect.  Vitals reviewed.   ED Course  Procedures (including critical care time) Labs Review Labs Reviewed  COMPREHENSIVE METABOLIC PANEL - Abnormal; Notable for the following:    Potassium 3.4 (*)    Glucose, Bld 113 (*)  Total Protein 8.2 (*)    Albumin 5.1 (*)    All other components within normal limits  LIPASE, BLOOD  CBC WITH DIFFERENTIAL/PLATELET  I-STAT CG4 LACTIC ACID, ED    Imaging Review No results found. I have personally reviewed and evaluated these images and lab results as part of my medical decision-making.   EKG Interpretation None      MDM   Final diagnoses:  Non-intractable cyclical vomiting with nausea    24 y.o. male presents  with recurrent episode of cyclic vomiting. Similar to prior. Cannabis user and this is thought to be related. Labs unremarkable. No imaging indicated. Given haldol and not vomiting further in ED. Able to tolerate po. Plan to follow up with PCP as needed and return precautions discussed for worsening or new concerning symptoms.      Lyndal Pulley, MD 12/04/15 [phone]

## 2016-04-07 ENCOUNTER — Emergency Department (HOSPITAL_COMMUNITY)
Admission: EM | Admit: 2016-04-07 | Discharge: 2016-04-07 | Disposition: A | Discharge status: Home / Self Care | Payer: BLUE CROSS/BLUE SHIELD | Attending: Emergency Medicine | Admitting: Emergency Medicine

## 2016-04-07 ENCOUNTER — Encounter (HOSPITAL_COMMUNITY): Payer: Self-pay | Admitting: Emergency Medicine

## 2016-04-07 DIAGNOSIS — F12188 Cannabis abuse with other cannabis-induced disorder: Secondary | ICD-10-CM | POA: Diagnosis not present

## 2016-04-07 DIAGNOSIS — E860 Dehydration: Secondary | ICD-10-CM | POA: Insufficient documentation

## 2016-04-07 DIAGNOSIS — Z79899 Other long term (current) drug therapy: Secondary | ICD-10-CM | POA: Insufficient documentation

## 2016-04-07 DIAGNOSIS — Z8659 Personal history of other mental and behavioral disorders: Secondary | ICD-10-CM | POA: Diagnosis not present

## 2016-04-07 DIAGNOSIS — R109 Unspecified abdominal pain: Secondary | ICD-10-CM | POA: Insufficient documentation

## 2016-04-07 DIAGNOSIS — F12988 Cannabis use, unspecified with other cannabis-induced disorder: Secondary | ICD-10-CM

## 2016-04-07 DIAGNOSIS — R112 Nausea with vomiting, unspecified: Secondary | ICD-10-CM | POA: Insufficient documentation

## 2016-04-07 DIAGNOSIS — K219 Gastro-esophageal reflux disease without esophagitis: Secondary | ICD-10-CM | POA: Insufficient documentation

## 2016-04-07 LAB — CBC WITH DIFFERENTIAL/PLATELET
BASOS ABS: 0 10*3/uL (ref 0.0–0.1)
Basophils Relative: 0 %
Eosinophils Absolute: 0 10*3/uL (ref 0.0–0.7)
Eosinophils Relative: 0 %
HEMATOCRIT: 43.3 % (ref 39.0–52.0)
HEMOGLOBIN: 15.2 g/dL (ref 13.0–17.0)
LYMPHS PCT: 11 %
Lymphs Abs: 0.9 10*3/uL (ref 0.7–4.0)
MCH: 30.5 pg (ref 26.0–34.0)
MCHC: 35.1 g/dL (ref 30.0–36.0)
MCV: 86.8 fL (ref 78.0–100.0)
MONO ABS: 0.8 10*3/uL (ref 0.1–1.0)
Monocytes Relative: 10 %
NEUTROS ABS: 6.1 10*3/uL (ref 1.7–7.7)
Neutrophils Relative %: 79 %
Platelets: 188 10*3/uL (ref 150–400)
RBC: 4.99 MIL/uL (ref 4.22–5.81)
RDW: 12 % (ref 11.5–15.5)
WBC: 7.8 10*3/uL (ref 4.0–10.5)

## 2016-04-07 LAB — URINE MICROSCOPIC-ADD ON

## 2016-04-07 LAB — COMPREHENSIVE METABOLIC PANEL
ALK PHOS: 88 U/L (ref 38–126)
ALT: 18 U/L (ref 17–63)
ANION GAP: 9 (ref 5–15)
AST: 19 U/L (ref 15–41)
Albumin: 5 g/dL (ref 3.5–5.0)
BILIRUBIN TOTAL: 0.7 mg/dL (ref 0.3–1.2)
BUN: 16 mg/dL (ref 6–20)
CALCIUM: 9.7 mg/dL (ref 8.9–10.3)
CO2: 28 mmol/L (ref 22–32)
Chloride: 108 mmol/L (ref 101–111)
Creatinine, Ser: 1.15 mg/dL (ref 0.61–1.24)
GLUCOSE: 114 mg/dL — AB (ref 65–99)
Potassium: 3.8 mmol/L (ref 3.5–5.1)
Sodium: 145 mmol/L (ref 135–145)
TOTAL PROTEIN: 7.6 g/dL (ref 6.5–8.1)

## 2016-04-07 LAB — URINALYSIS, ROUTINE W REFLEX MICROSCOPIC
Glucose, UA: NEGATIVE mg/dL
Hgb urine dipstick: NEGATIVE
Leukocytes, UA: NEGATIVE
NITRITE: NEGATIVE
PH: 7 (ref 5.0–8.0)
Protein, ur: 30 mg/dL — AB
Specific Gravity, Urine: 1.042 — ABNORMAL HIGH (ref 1.005–1.030)

## 2016-04-07 LAB — LIPASE, BLOOD: LIPASE: 37 U/L (ref 11–51)

## 2016-04-07 MED ORDER — ALUM & MAG HYDROXIDE-SIMETH 200-200-20 MG/5ML PO SUSP
30.0000 mL | ORAL | Status: DC | PRN
Start: 2016-04-07 — End: 2016-04-07
  Administered 2016-04-07: 30 mL via ORAL
  Filled 2016-04-07: qty 30

## 2016-04-07 MED ORDER — GI COCKTAIL ~~LOC~~
30.0000 mL | Freq: Once | ORAL | Status: AC
  Administered 2016-04-07: 30 mL via ORAL
  Filled 2016-04-07: qty 30

## 2016-04-07 MED ORDER — HALOPERIDOL LACTATE 5 MG/ML IJ SOLN
5.0000 mg | Freq: Once | INTRAMUSCULAR | Status: AC
  Administered 2016-04-07: 5 mg via INTRAVENOUS
  Filled 2016-04-07: qty 1

## 2016-04-07 MED ORDER — HALOPERIDOL 2 MG PO TABS: 2.0000 mg | ORAL_TABLET | Freq: Two times a day (BID) | ORAL | Status: DC | PRN

## 2016-04-07 MED ORDER — SODIUM CHLORIDE 0.9 % IV BOLUS (SEPSIS)
1000.0000 mL | Freq: Once | INTRAVENOUS | Status: AC
  Administered 2016-04-07: 1000 mL via INTRAVENOUS

## 2016-04-07 NOTE — ED Notes (Signed)
Discharge instructions, follow up care, and rx x1 reviewed with patient. Patient verbalized understanding. 

## 2016-04-07 NOTE — ED Notes (Signed)
Per patient, states nausea and vomiting for a week-[facility] and was given fluids-related to his THC use-last use was last Sunday and Monday

## 2016-04-07 NOTE — ED Provider Notes (Signed)
CSN: [id number]     Arrival date & time 04/07/16  0843 History   First MD Initiated Contact with Patient 04/07/16 0857     Chief Complaint  Patient presents with  . Nausea     (Consider location/radiation/quality/duration/timing/severity/associated sxs/prior Treatment) Patient is a 25 y.o. male presenting with vomiting.  Emesis Severity:  Severe Duration:  1 week Timing:  Constant Number of daily episodes:  10 Progression:  Unchanged Chronicity:  New Recent urination:  Normal (got fluids on Sunday at urgent care, improved) Ineffective treatments: zofran. Associated symptoms: abdominal pain (discomfort throughout whole stomach)   Associated symptoms: no chills, no cough, no diarrhea, no fever, no headaches, no sore throat and no URI   Risk factors: no diabetes and no prior abdominal surgery     Past Medical History  Diagnosis Date  . Anxiety   . GERD (gastroesophageal reflux disease)   . Nausea and vomiting     chronic, recurrent   Past Surgical History  Procedure Laterality Date  . Esophagogastroduodenoscopy  10/31/2014   Family History  Problem Relation Age of Onset  . High blood pressure Maternal Grandmother    Social History  Substance Use Topics  . Smoking status: Never Smoker   . Smokeless tobacco: None  . Alcohol Use: No     Comment: Rarely    Review of Systems  Constitutional: Negative for fever and chills.  HENT: Negative for sore throat.   Eyes: Negative for visual disturbance.  Respiratory: Negative for shortness of breath.   Cardiovascular: Negative for chest pain.  Gastrointestinal: Positive for nausea, vomiting and abdominal pain (discomfort throughout whole stomach). Negative for diarrhea and constipation.  Genitourinary: Negative for difficulty urinating.  Musculoskeletal: Negative for back pain and neck stiffness.  Skin: Negative for rash.  Neurological: Negative for syncope and headaches.      Allergies  Lorazepam  Home Medications    Prior to Admission medications   Medication Sig Start Date End Date Taking? Authorizing Provider  metoCLOPramide (REGLAN) 10 MG tablet Take 1 tablet (10 mg total) by mouth 3 (three) times daily before meals. 01/15/15  Yes Richarda OverlieNayana Abrol, MD  ondansetron (ZOFRAN ODT) 4 MG disintegrating tablet Take 1 tablet (4 mg total) by mouth every 4 (four) hours as needed for nausea or vomiting. 11/29/15  Yes Arby BarretteMarcy Pfeiffer, MD  pantoprazole (PROTONIX) 40 MG tablet Take 1 tablet (40 mg total) by mouth daily. Patient taking differently: Take 40 mg by mouth daily as needed (heartburn).  05/19/15  Yes Tilden FossaElizabeth Rees, MD  ranitidine (ZANTAC) 150 MG tablet Take 150 mg by mouth 2 (two) times daily.   Yes Historical Provider, MD  haloperidol (HALDOL) 2 MG tablet Take 1 tablet (2 mg total) by mouth 2 (two) times daily as needed (nausea/vomiting). 04/07/16   Alvira MondayErin Randye Treichler, MD  pantoprazole (PROTONIX) 20 MG tablet Take 1 tablet (20 mg total) by mouth daily. Patient not taking: Reported on 12/03/2015 11/29/15   Arby BarretteMarcy Pfeiffer, MD   BP 116/73 mmHg  Pulse 61  Temp(Src) 97.9 F (36.6 C) (Oral)  Resp 14  SpO2 98% Physical Exam  Constitutional: He is oriented to person, place, and time. He appears well-developed and well-nourished. No distress.  HENT:  Head: Normocephalic and atraumatic.  Eyes: Conjunctivae and EOM are normal.  Neck: Normal range of motion.  Cardiovascular: Normal rate, regular rhythm, normal heart sounds and intact distal pulses.  Exam reveals no gallop and no friction rub.   No murmur heard. Pulmonary/Chest: Effort normal and breath  sounds normal. No respiratory distress. He has no wheezes. He has no rales.  Abdominal: Soft. He exhibits no distension. There is no tenderness. There is no guarding.  Musculoskeletal: He exhibits no edema.  Neurological: He is alert and oriented to person, place, and time.  Skin: Skin is warm and dry. He is not diaphoretic.  Nursing note and vitals reviewed.   ED  Course  Procedures (including critical care time) Labs Review Labs Reviewed  COMPREHENSIVE METABOLIC PANEL - Abnormal; Notable for the following:    Glucose, Bld 114 (*)    All other components within normal limits  URINALYSIS, ROUTINE W REFLEX MICROSCOPIC (NOT AT [facility]) - Abnormal; Notable for the following:    Color, Urine AMBER (*)    Specific Gravity, Urine 1.042 (*)    Bilirubin Urine SMALL (*)    Ketones, ur >80 (*)    Protein, ur 30 (*)    All other components within normal limits  URINE MICROSCOPIC-ADD ON - Abnormal; Notable for the following:    Squamous Epithelial / LPF 0-5 (*)    Bacteria, UA FEW (*)    All other components within normal limits  CBC WITH DIFFERENTIAL/PLATELET  LIPASE, BLOOD    Imaging Review No results found. I have personally reviewed and evaluated these images and lab results as part of my medical decision-making.   EKG Interpretation None      MDM   Final diagnoses:  Cannabinoid hyperemesis syndrome [facility])  Dehydration   25 year old male with a history of anxiety, GERD, cannabinoid hyperemesis syndrome presents with concern of nausea and vomiting.  Patient with a benign abdominal exam, have low suspicion for appendicitis, cholecystitis or diverticulitis. Urinalysis shows signs of dehydration/starvation ketoacidosis however no other abnormalities. CMP and lipase WNL. Patient appears dehydrated on exam, was given 1 L of normal saline. He reports improvement of his hyperemesis with Haldol in the past, and was given  per what he had received last ED visit.  Patient improved with haldol, able to tolerate po in the ED. Given rx for 4 tablets of haldol to take BID prn nausea. Discussed marijuana cessation and recommend PCP follow up. Patient discharged in stable condition with understanding of reasons to return.   Alvira Monday, MD 04/07/16 2120

## 2016-04-07 NOTE — ED Notes (Signed)
MD at bedside. 

## 2016-06-26 ENCOUNTER — Emergency Department (HOSPITAL_COMMUNITY)
Admission: EM | Admit: 2016-06-26 | Discharge: 2016-06-26 | Disposition: A | Discharge status: Home / Self Care | Payer: BLUE CROSS/BLUE SHIELD | Attending: Emergency Medicine | Admitting: Emergency Medicine

## 2016-06-26 ENCOUNTER — Encounter (HOSPITAL_COMMUNITY): Payer: Self-pay

## 2016-06-26 DIAGNOSIS — R112 Nausea with vomiting, unspecified: Secondary | ICD-10-CM | POA: Diagnosis not present

## 2016-06-26 DIAGNOSIS — R1084 Generalized abdominal pain: Secondary | ICD-10-CM | POA: Insufficient documentation

## 2016-06-26 LAB — COMPREHENSIVE METABOLIC PANEL
ALT: 21 U/L (ref 17–63)
AST: 23 U/L (ref 15–41)
Albumin: 5.5 g/dL — ABNORMAL HIGH (ref 3.5–5.0)
Alkaline Phosphatase: 87 U/L (ref 38–126)
Anion gap: 14 (ref 5–15)
BILIRUBIN TOTAL: 1.5 mg/dL — AB (ref 0.3–1.2)
BUN: 21 mg/dL — ABNORMAL HIGH (ref 6–20)
CHLORIDE: 101 mmol/L (ref 101–111)
CO2: 23 mmol/L (ref 22–32)
CREATININE: 1.15 mg/dL (ref 0.61–1.24)
Calcium: 10 mg/dL (ref 8.9–10.3)
Glucose, Bld: 108 mg/dL — ABNORMAL HIGH (ref 65–99)
POTASSIUM: 3.9 mmol/L (ref 3.5–5.1)
Sodium: 138 mmol/L (ref 135–145)
TOTAL PROTEIN: 8.6 g/dL — AB (ref 6.5–8.1)

## 2016-06-26 LAB — URINE MICROSCOPIC-ADD ON: SQUAMOUS EPITHELIAL / LPF: NONE SEEN

## 2016-06-26 LAB — CBC
HCT: 46.2 % (ref 39.0–52.0)
Hemoglobin: 15.9 g/dL (ref 13.0–17.0)
MCH: 29.8 pg (ref 26.0–34.0)
MCHC: 34.4 g/dL (ref 30.0–36.0)
MCV: 86.5 fL (ref 78.0–100.0)
PLATELETS: 254 10*3/uL (ref 150–400)
RBC: 5.34 MIL/uL (ref 4.22–5.81)
RDW: 12.6 % (ref 11.5–15.5)
WBC: 8.9 10*3/uL (ref 4.0–10.5)

## 2016-06-26 LAB — URINALYSIS, ROUTINE W REFLEX MICROSCOPIC
Bilirubin Urine: NEGATIVE
GLUCOSE, UA: NEGATIVE mg/dL
Hgb urine dipstick: NEGATIVE
LEUKOCYTES UA: NEGATIVE
NITRITE: NEGATIVE
PROTEIN: 30 mg/dL — AB
Specific Gravity, Urine: 1.04 — ABNORMAL HIGH (ref 1.005–1.030)
pH: 6 (ref 5.0–8.0)

## 2016-06-26 LAB — LIPASE, BLOOD: LIPASE: 30 U/L (ref 11–51)

## 2016-06-26 MED ORDER — METOCLOPRAMIDE HCL 5 MG/ML IJ SOLN
10.0000 mg | INTRAMUSCULAR | Status: AC
  Administered 2016-06-26: 10 mg via INTRAVENOUS
  Filled 2016-06-26: qty 2

## 2016-06-26 MED ORDER — HALOPERIDOL 2 MG PO TABS: 2.0000 mg | ORAL_TABLET | Freq: Two times a day (BID) | ORAL | Status: DC

## 2016-06-26 MED ORDER — ONDANSETRON 4 MG PO TBDP
4.0000 mg | ORAL_TABLET | Freq: Once | ORAL | Status: AC | PRN
  Administered 2016-06-26: 4 mg via ORAL
  Filled 2016-06-26: qty 1

## 2016-06-26 MED ORDER — SODIUM CHLORIDE 0.9 % IV BOLUS (SEPSIS)
1000.0000 mL | Freq: Once | INTRAVENOUS | Status: AC
  Administered 2016-06-26: 1000 mL via INTRAVENOUS

## 2016-06-26 MED ORDER — HALOPERIDOL LACTATE 5 MG/ML IJ SOLN
5.0000 mg | Freq: Once | INTRAMUSCULAR | Status: AC
  Administered 2016-06-26: 5 mg via INTRAVENOUS
  Filled 2016-06-26: qty 1

## 2016-06-26 NOTE — ED Provider Notes (Signed)
CSN: [id number]     Arrival date & time 06/26/16  1355 History   First MD Initiated Contact with Patient 06/26/16 1522     Chief Complaint  Patient presents with  . Emesis  . Abdominal Pain     (Consider location/radiation/quality/duration/timing/severity/associated sxs/prior Treatment) Patient is a 25 y.o. male presenting with vomiting and abdominal pain. The history is provided by the patient and medical records.  Emesis Associated symptoms: abdominal pain   Abdominal Pain Associated symptoms: nausea and vomiting     25 year old male with history of anxiety, GERD, cannabinoid induced cyclic vomiting, presenting to the ED for nausea and vomiting.  Patient admitted to having generalized abdominal discomfort and nausea and vomiting for the past 5 days. He states he knows this is due to smoking marijuana. He states he generally smokes on a daily basis as he is "stressed out after work". He states he has had multiple episodes of the same, was seen in the ED last in April 2017. He states his emesis has been nonbloody and nonbilious. He denies any fever or chills. No diarrhea. He states initially he was able to drink ginger ale, however now he can no longer hold down any food or fluids. He states he has prescriptions for Protonix, Reglan, and Zantac but has been unable to hold these medications down for the past few days. He has seen GI in the past, Dr. Leone PayorGessner and had an endoscopy in 2015 with some findings of esophagitis, however no further workup indicated.  VSS.  Past Medical History  Diagnosis Date  . Anxiety   . GERD (gastroesophageal reflux disease)   . Nausea and vomiting     chronic, recurrent   Past Surgical History  Procedure Laterality Date  . Esophagogastroduodenoscopy  10/31/2014   Family History  Problem Relation Age of Onset  . High blood pressure Maternal Grandmother    Social History  Substance Use Topics  . Smoking status: Never Smoker   . Smokeless tobacco: None   . Alcohol Use: No     Comment: Rarely    Review of Systems  Gastrointestinal: Positive for nausea, vomiting and abdominal pain.  All other systems reviewed and are negative.     Allergies  Lorazepam  Home Medications   Prior to Admission medications   Medication Sig Start Date End Date Taking? Authorizing Provider  metoCLOPramide (REGLAN) 10 MG tablet Take 1 tablet (10 mg total) by mouth 3 (three) times daily before meals. 01/15/15  Yes Richarda OverlieNayana Abrol, MD  pantoprazole (PROTONIX) 40 MG tablet Take 1 tablet (40 mg total) by mouth daily. Patient taking differently: Take 40 mg by mouth daily as needed (heartburn).  05/19/15  Yes Tilden FossaElizabeth Rees, MD  haloperidol (HALDOL) 2 MG tablet Take 1 tablet (2 mg total) by mouth 2 (two) times daily as needed (nausea/vomiting). Patient not taking: Reported on 06/26/2016 04/07/16   Alvira MondayErin Schlossman, MD  ondansetron (ZOFRAN ODT) 4 MG disintegrating tablet Take 1 tablet (4 mg total) by mouth every 4 (four) hours as needed for nausea or vomiting. Patient not taking: Reported on 06/26/2016 11/29/15   Arby BarretteMarcy Pfeiffer, MD  pantoprazole (PROTONIX) 20 MG tablet Take 1 tablet (20 mg total) by mouth daily. Patient not taking: Reported on 12/03/2015 11/29/15   Arby BarretteMarcy Pfeiffer, MD   BP 119/74 mmHg  Pulse 74  Temp(Src) 97.9 F (36.6 C) (Oral)  Resp 16  SpO2 99%   Physical Exam  Constitutional: He is oriented to person, place, and time. He appears well-developed  and well-nourished.  HENT:  Head: Normocephalic and atraumatic.  Mouth/Throat: Oropharynx is clear and moist.  Dry mucous membranes  Eyes: Conjunctivae and EOM are normal. Pupils are equal, round, and reactive to light.  Neck: Normal range of motion.  Cardiovascular: Normal rate, regular rhythm and normal heart sounds.   Pulmonary/Chest: Effort normal and breath sounds normal.  Abdominal: Soft. Bowel sounds are normal. There is no tenderness. There is no rigidity and no guarding.  Abdomen soft, benign   Musculoskeletal: Normal range of motion.  Neurological: He is alert and oriented to person, place, and time.  Skin: Skin is warm and dry.  Psychiatric: He has a normal mood and affect.  Nursing note and vitals reviewed.   ED Course  Procedures (including critical care time) Labs Review Labs Reviewed  COMPREHENSIVE METABOLIC PANEL - Abnormal; Notable for the following:    Glucose, Bld 108 (*)    BUN 21 (*)    Total Protein 8.6 (*)    Albumin 5.5 (*)    Total Bilirubin 1.5 (*)    All other components within normal limits  URINALYSIS, ROUTINE W REFLEX MICROSCOPIC (NOT AT [facility]) - Abnormal; Notable for the following:    Color, Urine AMBER (*)    Specific Gravity, Urine 1.040 (*)    Ketones, ur >80 (*)    Protein, ur 30 (*)    All other components within normal limits  URINE MICROSCOPIC-ADD ON - Abnormal; Notable for the following:    Bacteria, UA RARE (*)    All other components within normal limits  LIPASE, BLOOD  CBC    Imaging Review No results found. I have personally reviewed and evaluated these images and lab results as part of my medical decision-making.   EKG Interpretation None      MDM   Final diagnoses:  Nausea and vomiting, vomiting of unspecified type   25 year old male here with nausea and vomiting. He has known history of cannabinoid cyclic vomiting syndrome. Seen by GI in the past with noted esophagitis, no other acute recommendations. Patient is afebrile, nontoxic. His abdomen is soft and benign. Mucous membranes do appear mildly dry. Labwork is overall reassuring aside from some signs of dehydration given ketones in urine and elevated BUN. Patient treated with IV fluids, Haldol, and Reglan with improvement of symptoms. No active vomiting here in the ED. Has been able to tolerate oral ginger ale without difficulty. Vital signs remained stable. Discharge home with symptomatically care. Advised to continue Reglan and Protonix which he has at home. Discussed  marijuana cessation once again.  Follow-up with PCP.  Discussed plan with patient, he/she acknowledged understanding and agreed with plan of care.  Return precautions given for new or worsening symptoms.  Garlon HatchetLisa M Dakiyah Heinke, PA-C 06/26/16 1902  Lavera Guiseana Duo Liu, MD 06/27/16 1224

## 2016-06-26 NOTE — ED Notes (Signed)
Pt cannot use restroom at this time, aware urine specimen is needed.  

## 2016-06-26 NOTE — ED Notes (Signed)
Pt c/o abdominal discomfort and emesis x 5 days.  Pain score 7/10.  Pt reports "I've been smoking too much marijuana."

## 2016-06-26 NOTE — Discharge Instructions (Signed)
May take home reglan as needed to continue nausea/vomiting.  Recommend to take protonix as well. Follow-up with your primary care doctor. Return to the ED for new or worsening symptoms.

## 2016-06-26 NOTE — ED Notes (Signed)
Pt given ginger ale.

## 2016-06-28 ENCOUNTER — Emergency Department (HOSPITAL_COMMUNITY)
Admission: EM | Admit: 2016-06-28 | Discharge: 2016-06-28 | Disposition: A | Discharge status: Home / Self Care | Payer: BLUE CROSS/BLUE SHIELD | Attending: Emergency Medicine | Admitting: Emergency Medicine

## 2016-06-28 ENCOUNTER — Encounter (HOSPITAL_COMMUNITY): Payer: Self-pay

## 2016-06-28 DIAGNOSIS — R1084 Generalized abdominal pain: Secondary | ICD-10-CM | POA: Diagnosis present

## 2016-06-28 DIAGNOSIS — G43A0 Cyclical vomiting, not intractable: Secondary | ICD-10-CM | POA: Diagnosis not present

## 2016-06-28 DIAGNOSIS — R1115 Cyclical vomiting syndrome unrelated to migraine: Secondary | ICD-10-CM

## 2016-06-28 LAB — URINE MICROSCOPIC-ADD ON

## 2016-06-28 LAB — CBC
HEMATOCRIT: 46.7 % (ref 39.0–52.0)
HEMOGLOBIN: 16.2 g/dL (ref 13.0–17.0)
MCH: 30.2 pg (ref 26.0–34.0)
MCHC: 34.7 g/dL (ref 30.0–36.0)
MCV: 87 fL (ref 78.0–100.0)
PLATELETS: 245 10*3/uL (ref 150–400)
RBC: 5.37 MIL/uL (ref 4.22–5.81)
RDW: 12.6 % (ref 11.5–15.5)
WBC: 9.2 10*3/uL (ref 4.0–10.5)

## 2016-06-28 LAB — COMPREHENSIVE METABOLIC PANEL
ALT: 19 U/L (ref 17–63)
ANION GAP: 11 (ref 5–15)
AST: 25 U/L (ref 15–41)
Albumin: 5.2 g/dL — ABNORMAL HIGH (ref 3.5–5.0)
Alkaline Phosphatase: 77 U/L (ref 38–126)
BUN: 15 mg/dL (ref 6–20)
CHLORIDE: 106 mmol/L (ref 101–111)
CO2: 22 mmol/L (ref 22–32)
Calcium: 9.8 mg/dL (ref 8.9–10.3)
Creatinine, Ser: 0.98 mg/dL (ref 0.61–1.24)
Glucose, Bld: 118 mg/dL — ABNORMAL HIGH (ref 65–99)
POTASSIUM: 3.9 mmol/L (ref 3.5–5.1)
SODIUM: 139 mmol/L (ref 135–145)
Total Bilirubin: 1.5 mg/dL — ABNORMAL HIGH (ref 0.3–1.2)
Total Protein: 8.3 g/dL — ABNORMAL HIGH (ref 6.5–8.1)

## 2016-06-28 LAB — URINALYSIS, ROUTINE W REFLEX MICROSCOPIC
GLUCOSE, UA: NEGATIVE mg/dL
HGB URINE DIPSTICK: NEGATIVE
Ketones, ur: >80 mg/dL — AB
LEUKOCYTES UA: NEGATIVE
Nitrite: NEGATIVE
PH: 6 (ref 5.0–8.0)
PROTEIN: 30 mg/dL — AB
SPECIFIC GRAVITY, URINE: 1.041 — AB (ref 1.005–1.030)

## 2016-06-28 LAB — LIPASE, BLOOD: Lipase: 37 U/L (ref 11–51)

## 2016-06-28 MED ORDER — SODIUM CHLORIDE 0.9 % IV BOLUS (SEPSIS)
1000.0000 mL | Freq: Once | INTRAVENOUS | Status: AC
  Administered 2016-06-28: 1000 mL via INTRAVENOUS

## 2016-06-28 MED ORDER — FAMOTIDINE 20 MG PO TABS
20.0000 mg | ORAL_TABLET | Freq: Once | ORAL | Status: AC
  Administered 2016-06-28: 20 mg via ORAL
  Filled 2016-06-28: qty 1

## 2016-06-28 MED ORDER — HALOPERIDOL LACTATE 5 MG/ML IJ SOLN
4.0000 mg | Freq: Once | INTRAMUSCULAR | Status: AC
  Administered 2016-06-28: 4 mg via INTRAVENOUS
  Filled 2016-06-28: qty 1

## 2016-06-28 MED ORDER — METOCLOPRAMIDE HCL 10 MG PO TABS: 10.0000 mg | ORAL_TABLET | Freq: Four times a day (QID) | ORAL | Status: DC

## 2016-06-28 MED ORDER — FAMOTIDINE IN NACL 20-0.9 MG/50ML-% IV SOLN
20.0000 mg | Freq: Once | INTRAVENOUS | Status: DC
  Filled 2016-06-28: qty 50

## 2016-06-28 MED ORDER — ONDANSETRON HCL 4 MG/2ML IJ SOLN
4.0000 mg | Freq: Once | INTRAMUSCULAR | Status: AC
  Administered 2016-06-28: 4 mg via INTRAVENOUS
  Filled 2016-06-28: qty 2

## 2016-06-28 MED ORDER — METOCLOPRAMIDE HCL 5 MG/ML IJ SOLN
10.0000 mg | Freq: Once | INTRAMUSCULAR | Status: AC
  Administered 2016-06-28: 10 mg via INTRAVENOUS
  Filled 2016-06-28: qty 2

## 2016-06-28 MED ORDER — SODIUM CHLORIDE 0.9 % IV BOLUS (SEPSIS): 1000.0000 mL | Freq: Once | INTRAVENOUS | Status: DC

## 2016-06-28 NOTE — ED Notes (Signed)
Pt reports nausea, medicated as ordered.  Pt denies any pain at this time.

## 2016-06-28 NOTE — ED Notes (Signed)
Requested urine from patient. Patient was here two days ago. Patient states that he started vomiting and can not keep nothing down. Patient states he did not have a fever.

## 2016-06-28 NOTE — ED Provider Notes (Addendum)
CSN: [id number]     Arrival date & time 06/28/16  [id number] History   First MD Initiated Contact with Patient 06/28/16 [phone]     Chief Complaint  Patient presents with  . Abdominal Pain  . Emesis   HPI Comments: 25 year old male presents with abdominal pain, nausea, vomiting. PMH significant for anxiety, GERD, cannabinoid induced cyclic vomiting. He was last seen in the ED on 6/30 for the same. Patient states he has had these symptoms on and off for the last 2 years. He has been evaluated by GI and diagnosed with hyperemesis cannabinoid syndrome. Last EGD was in 2015 by Dr. Leone PayorGessner which showed esophagitis. He states he received IVF, Reglan, and Haldol when he was here 2 days ago with relief of symptoms. He started having symptoms again 1 day ago. He states he currently still smokes marijuana daily. He also believes there may be an anxiety component to his symptoms and reports increased stress from work. He denies fever, chills, chest pain, SOB, diarrhea, irritative voiding symptoms. He states his vomit is non-bloody but is sometimes dark. He has prescriptions for Protonix, Reglan, and Zantac but girlfriend at bedside states he stops taking these once he feels better.       Patient is a 25 y.o. male presenting with abdominal pain and vomiting.  Abdominal Pain Associated symptoms: nausea and vomiting   Associated symptoms: no chest pain, no chills, no diarrhea, no dysuria, no fever and no shortness of breath   Emesis Associated symptoms: abdominal pain   Associated symptoms: no chills and no diarrhea     Past Medical History  Diagnosis Date  . Anxiety   . GERD (gastroesophageal reflux disease)   . Nausea and vomiting     chronic, recurrent   Past Surgical History  Procedure Laterality Date  . Esophagogastroduodenoscopy  10/31/2014   Family History  Problem Relation Age of Onset  . High blood pressure Maternal Grandmother    Social History  Substance Use Topics  . Smoking status: Never  Smoker   . Smokeless tobacco: None  . Alcohol Use: No     Comment: Rarely    Review of Systems  Constitutional: Negative for fever and chills.  Respiratory: Negative for shortness of breath.   Cardiovascular: Negative for chest pain.  Gastrointestinal: Positive for nausea, vomiting and abdominal pain. Negative for diarrhea.  Genitourinary: Negative for dysuria and flank pain.  Psychiatric/Behavioral: Positive for dysphoric mood.  All other systems reviewed and are negative.     Allergies  Lorazepam  Home Medications   Prior to Admission medications   Medication Sig Start Date End Date Taking? Authorizing Provider  metoCLOPramide (REGLAN) 10 MG tablet Take 1 tablet (10 mg total) by mouth 3 (three) times daily before meals. 01/15/15   Richarda OverlieNayana Abrol, MD  ondansetron (ZOFRAN ODT) 4 MG disintegrating tablet Take 1 tablet (4 mg total) by mouth every 4 (four) hours as needed for nausea or vomiting. Patient not taking: Reported on 06/26/2016 11/29/15   Arby BarretteMarcy Pfeiffer, MD  pantoprazole (PROTONIX) 20 MG tablet Take 1 tablet (20 mg total) by mouth daily. Patient not taking: Reported on 12/03/2015 11/29/15   Arby BarretteMarcy Pfeiffer, MD  pantoprazole (PROTONIX) 40 MG tablet Take 1 tablet (40 mg total) by mouth daily. Patient taking differently: Take 40 mg by mouth daily as needed (heartburn).  05/19/15   Tilden FossaElizabeth Rees, MD   BP 124/85 mmHg  Pulse 89  Temp(Src) 98.3 F (36.8 C) (Axillary)  Resp 18  SpO2 100% Physical  Exam  Constitutional: He is oriented to person, place, and time. He appears well-developed and well-nourished. No distress.  HENT:  Head: Normocephalic and atraumatic.  Mouth/Throat: Mucous membranes are dry.  Eyes: Conjunctivae are normal. Pupils are equal, round, and reactive to light. Right eye exhibits no discharge. Left eye exhibits no discharge. No scleral icterus.  Neck: Normal range of motion.  Cardiovascular: Normal rate and regular rhythm.  Exam reveals no gallop and no  friction rub.   No murmur heard. Pulmonary/Chest: Effort normal and breath sounds normal. No respiratory distress. He has no wheezes. He has no rales. He exhibits no tenderness.  Abdominal: Soft. Normal appearance and bowel sounds are normal. He exhibits no distension and no mass. There is tenderness. There is no rebound and no guarding.  Moderate, diffuse tenderness  Neurological: He is alert and oriented to person, place, and time.  Skin: Skin is warm and dry.  Psychiatric: His mood appears anxious.    ED Course  Procedures (including critical care time) Labs Review Labs Reviewed  COMPREHENSIVE METABOLIC PANEL - Abnormal; Notable for the following:    Glucose, Bld 118 (*)    Total Protein 8.3 (*)    Albumin 5.2 (*)    Total Bilirubin 1.5 (*)    All other components within normal limits  URINALYSIS, ROUTINE W REFLEX MICROSCOPIC (NOT AT [facility]) - Abnormal; Notable for the following:    Color, Urine AMBER (*)    Specific Gravity, Urine 1.041 (*)    Bilirubin Urine SMALL (*)    Ketones, ur >80 (*)    Protein, ur 30 (*)    All other components within normal limits  URINE MICROSCOPIC-ADD ON - Abnormal; Notable for the following:    Squamous Epithelial / LPF 0-5 (*)    Bacteria, UA RARE (*)    All other components within normal limits  LIPASE, BLOOD  CBC    Imaging Review No results found. I have personally reviewed and evaluated these images and lab results as part of my medical decision-making.   EKG Interpretation None      MDM   Final diagnoses:  Generalized abdominal pain  Non-intractable cyclical vomiting with nausea   25 year old male here with nausea and vomiting. He states his symptoms are exactly the same as 2 days ago. Patient treated similarly with IVF Zofran, Reglan, Haldol with good relief. Pepcid given for reported coffee ground emesis. Patient is afebrile, not tachycardic or tachypneic, normotensive, and not hypoxic. Abdomen is soft and benign. Discussed  follow up with PCP for stress and anxiety related issues and GI for alternative treatments for his cyclical symptoms. Also discussed marijuana cessation or at least reducing the frequency of use. Pt given rx for Reglan. Patient is NAD, non-toxic, with stable VS. Patient is informed of clinical course, understands medical decision making process, and agrees with plan. Opportunity for questions provided and all questions answered. Return precautions given.     Bethel BornKelly Marie Siona Coulston, PA-C 06/28/16 1020  Melene Planan Floyd, DO 06/28/16 1155    Bethel BornKelly Marie Keir Viernes, PA-C 07/06/16 1503  Melene Planan Floyd, DO 07/06/16 1521

## 2016-06-28 NOTE — ED Notes (Signed)
Pt given urinal to obtain sample

## 2016-06-28 NOTE — ED Notes (Signed)
Patient c/o abdominal pain and vomiting since [facility]  Patient states that has abdominal discomfort mid epigastric region.  Patient states that has vomited x10 today and states that has a "coffee ground look to it"  Patient rates pain 6/10.

## 2016-07-01 ENCOUNTER — Emergency Department (HOSPITAL_COMMUNITY)
Admission: EM | Admit: 2016-07-01 | Discharge: 2016-07-01 | Disposition: A | Discharge status: Home / Self Care | Payer: BLUE CROSS/BLUE SHIELD | Attending: Emergency Medicine | Admitting: Emergency Medicine

## 2016-07-01 ENCOUNTER — Encounter (HOSPITAL_COMMUNITY): Payer: Self-pay

## 2016-07-01 DIAGNOSIS — K2970 Gastritis, unspecified, without bleeding: Secondary | ICD-10-CM | POA: Diagnosis not present

## 2016-07-01 DIAGNOSIS — R112 Nausea with vomiting, unspecified: Secondary | ICD-10-CM | POA: Diagnosis present

## 2016-07-01 DIAGNOSIS — Z79899 Other long term (current) drug therapy: Secondary | ICD-10-CM | POA: Diagnosis not present

## 2016-07-01 DIAGNOSIS — F12129 Cannabis abuse with intoxication, unspecified: Secondary | ICD-10-CM | POA: Diagnosis not present

## 2016-07-01 DIAGNOSIS — F12988 Cannabis use, unspecified with other cannabis-induced disorder: Secondary | ICD-10-CM

## 2016-07-01 HISTORY — DX: Nausea with vomiting, unspecified: R11.2

## 2016-07-01 HISTORY — DX: Cannabis use, unspecified, uncomplicated: F12.90

## 2016-07-01 HISTORY — DX: Cannabis use, unspecified with other cannabis-induced disorder: F12.988

## 2016-07-01 LAB — URINALYSIS, ROUTINE W REFLEX MICROSCOPIC
GLUCOSE, UA: NEGATIVE mg/dL
HGB URINE DIPSTICK: NEGATIVE
Nitrite: NEGATIVE
PROTEIN: 30 mg/dL — AB
Specific Gravity, Urine: 1.039 — ABNORMAL HIGH (ref 1.005–1.030)
pH: 6.5 (ref 5.0–8.0)

## 2016-07-01 LAB — CBC WITH DIFFERENTIAL/PLATELET
BASOS ABS: 0 10*3/uL (ref 0.0–0.1)
BASOS PCT: 0 %
EOS ABS: 0 10*3/uL (ref 0.0–0.7)
EOS PCT: 0 %
HCT: 44.5 % (ref 39.0–52.0)
HEMOGLOBIN: 16.4 g/dL (ref 13.0–17.0)
Lymphocytes Relative: 7 %
Lymphs Abs: 0.7 10*3/uL (ref 0.7–4.0)
MCH: 30.9 pg (ref 26.0–34.0)
MCHC: 36.9 g/dL — ABNORMAL HIGH (ref 30.0–36.0)
MCV: 83.8 fL (ref 78.0–100.0)
Monocytes Absolute: 0.9 10*3/uL (ref 0.1–1.0)
Monocytes Relative: 9 %
NEUTROS PCT: 83 %
Neutro Abs: 8.4 10*3/uL — ABNORMAL HIGH (ref 1.7–7.7)
PLATELETS: 224 10*3/uL (ref 150–400)
RBC: 5.31 MIL/uL (ref 4.22–5.81)
RDW: 12.2 % (ref 11.5–15.5)
WBC: 10.1 10*3/uL (ref 4.0–10.5)

## 2016-07-01 LAB — URINE MICROSCOPIC-ADD ON

## 2016-07-01 LAB — COMPREHENSIVE METABOLIC PANEL
ALBUMIN: 5.2 g/dL — AB (ref 3.5–5.0)
ALK PHOS: 83 U/L (ref 38–126)
ALT: 17 U/L (ref 17–63)
AST: 20 U/L (ref 15–41)
Anion gap: 9 (ref 5–15)
BUN: 17 mg/dL (ref 6–20)
CHLORIDE: 102 mmol/L (ref 101–111)
CO2: 26 mmol/L (ref 22–32)
CREATININE: 1.02 mg/dL (ref 0.61–1.24)
Calcium: 9.7 mg/dL (ref 8.9–10.3)
GFR calc non Af Amer: >60 mL/min (ref >60)
GLUCOSE: 120 mg/dL — AB (ref 65–99)
Potassium: 3.2 mmol/L — ABNORMAL LOW (ref 3.5–5.1)
SODIUM: 137 mmol/L (ref 135–145)
Total Bilirubin: 1.5 mg/dL — ABNORMAL HIGH (ref 0.3–1.2)
Total Protein: 8.2 g/dL — ABNORMAL HIGH (ref 6.5–8.1)

## 2016-07-01 LAB — LIPASE, BLOOD: Lipase: 49 U/L (ref 11–51)

## 2016-07-01 MED ORDER — ONDANSETRON 4 MG PO TBDP: 4.0000 mg | ORAL_TABLET | Freq: Three times a day (TID) | ORAL | Status: DC | PRN

## 2016-07-01 MED ORDER — SODIUM CHLORIDE 0.9 % IV BOLUS (SEPSIS)
1000.0000 mL | Freq: Once | INTRAVENOUS | Status: AC
  Administered 2016-07-01: 1000 mL via INTRAVENOUS

## 2016-07-01 MED ORDER — SODIUM CHLORIDE 0.9 % IV SOLN
8.0000 mg | Freq: Once | INTRAVENOUS | Status: AC
  Administered 2016-07-01: 8 mg via INTRAVENOUS
  Filled 2016-07-01: qty 4

## 2016-07-01 MED ORDER — POTASSIUM CHLORIDE ER 10 MEQ PO TBCR: 10.0000 meq | EXTENDED_RELEASE_TABLET | Freq: Two times a day (BID) | ORAL | Status: DC

## 2016-07-01 MED ORDER — HALOPERIDOL LACTATE 5 MG/ML IJ SOLN
5.0000 mg | Freq: Once | INTRAMUSCULAR | Status: AC
  Administered 2016-07-01: 5 mg via INTRAVENOUS
  Filled 2016-07-01: qty 1

## 2016-07-01 MED ORDER — PANTOPRAZOLE SODIUM 40 MG PO TBEC: 40.0000 mg | DELAYED_RELEASE_TABLET | Freq: Two times a day (BID) | ORAL | Status: DC

## 2016-07-01 MED ORDER — FAMOTIDINE IN NACL 20-0.9 MG/50ML-% IV SOLN
20.0000 mg | Freq: Once | INTRAVENOUS | Status: AC
  Administered 2016-07-01: 20 mg via INTRAVENOUS
  Filled 2016-07-01: qty 50

## 2016-07-01 MED ORDER — KCL IN DEXTROSE-NACL 20-5-0.45 MEQ/L-%-% IV SOLN
Freq: Once | INTRAVENOUS | Status: AC
  Administered 2016-07-01: 14:00:00 via INTRAVENOUS
  Filled 2016-07-01: qty 1000

## 2016-07-01 MED ORDER — GI COCKTAIL ~~LOC~~
30.0000 mL | Freq: Once | ORAL | Status: AC
  Administered 2016-07-01: 30 mL via ORAL
  Filled 2016-07-01: qty 30

## 2016-07-01 NOTE — ED Notes (Addendum)
Pt presents with c/o nausea and vomiting. Pt reports that he has been having these symptoms since 6/26 and has been seen twice since then for the same. Pt reports he is unable to keep anything on his stomach. Pt reports that this morning, he started to vomit up coffee ground emesis.

## 2016-07-01 NOTE — Discharge Instructions (Signed)
Cannabis Use Disorder °Cannabis use disorder is a mental disorder. It is not one-time or occasional use of cannabis, more commonly known as marijuana. Cannabis use disorder is the continued, nonmedical use of cannabis that interferes with normal life activities or causes health problems. People with cannabis use disorder get a feeling of extreme pleasure and relaxation from cannabis use. This "high" is very rewarding and causes people to use over and over.  °The mind-altering ingredient in cannabis is know as THC. THC can also interfere with motor coordination, memory, judgment, and accurate sense of space and time. These effects can last for a few days after using cannabis. Regular heavy cannabis use can cause long-lasting problems with thinking and learning. In young people, these problems may be permanent. Cannabis sometimes causes severe anxiety, paranoia, or visual hallucinations. Man-made (synthetic) cannabis-like drugs, such as "spice" and "K2," cause the same effects as THC but are much stronger. Cannabis-like drugs can cause dangerously high blood pressure and heart rate.  °Cannabis use disorder usually starts in the teenage years. It can trigger the development of schizophrenia. It is somewhat more common in men than women. People who have family members with the disorder or existing mental health issues such as depression and posttraumatic stress disorder are more likely to develop cannabis use disorder. People with cannabis use disorder are at higher risk for use of other drugs of abuse.  °SIGNS AND SYMPTOMS °Signs and symptoms of cannabis use disorder include:  °· Use of cannabis in larger amounts or over a longer period than intended.   °· Unsuccessful attempts to cut down or control cannabis use.   °· A lot of time spent obtaining, using, or recovering from the effects of cannabis.   °· A strong desire or urge to use cannabis (cravings).   °· Continued use of cannabis in spite of problems at work,  school, or home because of use.   °· Continued use of cannabis in spite of relationship problems because of use. °· Giving up or cutting down on important life activities because of cannabis use. °· Use of cannabis over and over even in situations when it is physically hazardous, such as when driving a car.   °· Continued use of cannabis in spite of a physical problem that is likely related to use. Physical problems can include: °· Chronic cough. °· Bronchitis. °· Emphysema. °· Throat and lung cancer. °· Continued use of cannabis in spite of a mental problem that is likely related to use. Mental problems can include: °· Psychosis. °· Anxiety. °· Difficulty sleeping. °· Need to use more and more cannabis to get the same effect, or lessened effect over time with use of the same amount (tolerance). °· Having withdrawal symptoms when cannabis use is stopped, or using cannabis to reduce or avoid withdrawal symptoms. Withdrawal symptoms include: °· Irritability or anger. °· Anxiety or restlessness. °· Difficulty sleeping. °· Loss of appetite or weight. °· Aches and pains. °· Shakiness. °· Sweating. °· Chills. °DIAGNOSIS °Cannabis use disorder is diagnosed by your health care provider. You may be asked questions about your cannabis use and how it affects your life. A physical exam may be done. A drug screen may be done. You may be referred to a mental health professional. The diagnosis of cannabis use disorder requires at least two symptoms within 12 months. The type of cannabis use disorder you have depends on the number of symptoms you have. The type may be: °· Mild. Two or three signs and symptoms.   °· Moderate. Four or   five signs and symptoms.   Severe. Six or more signs and symptoms.  TREATMENT Treatment is usually provided by mental health professionals with training in substance use disorders. The following options are available:  Counseling or talk therapy. Talk therapy addresses the reasons you use  cannabis. It also addresses ways to keep you from using again. The goals of talk therapy include:  Identifying and avoiding triggers for use.  Learning how to handle cravings.  Replacing use with healthy activities.  Support groups. Support groups provide emotional support, advice, and guidance.  Medicine. Medicine is used to treat mental health issues that trigger cannabis use or that result from it. HOME CARE INSTRUCTIONS  Take medicines only as directed by your health care provider.  Check with your health care provider before starting any new medicines.  Keep all follow-up visits as directed by your health care provider. SEEK MEDICAL CARE IF:  You are not able to take your medicines as directed.  Your symptoms get worse. SEEK IMMEDIATE MEDICAL CARE IF: You have serious thoughts about hurting yourself or others. FOR MORE INFORMATION  National Institute on Drug Abuse: http://www.price-smith.com/www.drugabuse.gov  Substance Abuse and Mental Health Services Administration: SkateOasis.com.ptwww.samhsa.gov   This information is not intended to replace advice given to you by your health care provider. Make sure you discuss any questions you have with your health care provider.   Document Released: 12/11/2000 Document Revised: 01/04/2015 Document Reviewed: 12/27/2013 Elsevier Interactive Patient Education 2016 Elsevier Inc.  Gastritis, Adult Gastritis is soreness and swelling (inflammation) of the lining of the stomach. Gastritis can develop as a sudden onset (acute) or long-term (chronic) condition. If gastritis is not treated, it can lead to stomach bleeding and ulcers. CAUSES  Gastritis occurs when the stomach lining is weak or damaged. Digestive juices from the stomach then inflame the weakened stomach lining. The stomach lining may be weak or damaged due to viral or bacterial infections. One common bacterial infection is the Helicobacter pylori infection. Gastritis can also result from excessive alcohol consumption,  taking certain medicines, or having too much acid in the stomach.  SYMPTOMS  In some cases, there are no symptoms. When symptoms are present, they may include:  Pain or a burning sensation in the upper abdomen.  Nausea.  Vomiting.  An uncomfortable feeling of fullness after eating. DIAGNOSIS  Your caregiver may suspect you have gastritis based on your symptoms and a physical exam. To determine the cause of your gastritis, your caregiver may perform the following:  Blood or stool tests to check for the H pylori bacterium.  Gastroscopy. A thin, flexible tube (endoscope) is passed down the esophagus and into the stomach. The endoscope has a light and camera on the end. Your caregiver uses the endoscope to view the inside of the stomach.  Taking a tissue sample (biopsy) from the stomach to examine under a microscope. TREATMENT  Depending on the cause of your gastritis, medicines may be prescribed. If you have a bacterial infection, such as an H pylori infection, antibiotics may be given. If your gastritis is caused by too much acid in the stomach, H2 blockers or antacids may be given. Your caregiver may recommend that you stop taking aspirin, ibuprofen, or other nonsteroidal anti-inflammatory drugs (NSAIDs). HOME CARE INSTRUCTIONS  Only take over-the-counter or prescription medicines as directed by your caregiver.  If you were given antibiotic medicines, take them as directed. Finish them even if you start to feel better.  Drink enough fluids to keep your urine clear  or pale yellow.  Avoid foods and drinks that make your symptoms worse, such as:  Caffeine or alcoholic drinks.  Chocolate.  Peppermint or mint flavorings.  Garlic and onions.  Spicy foods.  Citrus fruits, such as oranges, lemons, or limes.  Tomato-based foods such as sauce, chili, salsa, and pizza.  Fried and fatty foods.  Eat small, frequent meals instead of large meals. SEEK IMMEDIATE MEDICAL CARE IF:    You have black or dark red stools.  You vomit blood or material that looks like coffee grounds.  You are unable to keep fluids down.  Your abdominal pain gets worse.  You have a fever.  You do not feel better after 1 week.  You have any other questions or concerns. MAKE SURE YOU:  Understand these instructions.  Will watch your condition.  Will get help right away if you are not doing well or get worse.   This information is not intended to replace advice given to you by your health care provider. Make sure you discuss any questions you have with your health care provider.   Document Released: 12/08/2001 Document Revised: 06/14/2012 Document Reviewed: 01/27/2012 Elsevier Interactive Patient Education 2016 [employer]. [employer] Guide Outpatient Counseling/Substance Abuse Adult The United Ways 211 is a great source of information about community services available.  Access by dialing 2-1-1 from anywhere in [place], or by website -  PooledIncome.pl.   Other Local Resources (Updated 12/2015)  Crisis Hotlines   Services     Area Served  [employer]  Crisis Hotline, available 24 hours a day, 7 days a week: 539-381-6840 [facility] [place], [place]   [facility]  Crisis Hotline, available 24 hours a day, 7 days a week: 267-198-4892 [facility], [place]  [facility]  Suicide Prevention Hotline, available 24 hours a day, 7 days a week: (518)225-5420 [facility], [place]  [employer], available 24 hours a day, 7 days a week: [phone] [facility], [place]   [facility] Access to [employer], available 24 hours a day, 7 days a week: [phone] All   [facility]  Crisis Hotline, available 24 hours a day, 7 days a week: (207)595-7982 All   Other Local Resources (Updated 12/2015)  Outpatient Counseling/ Substance Abuse Programs  Services     Address and Phone Number   ADS ([facility]cohol and Drug Services)   Options include Individual counseling, group counseling, intensive outpatient program (several hours a day, several days a week)  Offers depression assessments  Provides methadone maintenance program [phone] [address] [place], [place] [address]   [facility]   Offers partial hospitalization/day treatment and DUI/DWI programs  [employer], private insurance [phone] [address] [place], [place] [address]  [facility]    Services include intensive outpatient program (several hours a day, several days a week), outpatient treatment, DUI/DWI services, family education  Also has some services specifically for [employer] transitional housing  [phone] [address] [place], [place] [address]     [facility]  [employer], private pay, and private insurance [phone] [address] [place], [place] [address]  [employer] [facility]  Services include individual counseling, substance abuse intensive outpatient program (several hours a day, several days a week), day treatment  Delene Loll, Medicaid, private insurance [phone] [address] [place], [place] [address]  [facility]   Offers substance  abuse intensive outpatient program (several hours a day, several days a week), partial hospitalization program [phone] [address] [place], [place] [address]  [phone] [address] [place], [place] [address]  [phone] [address] [place], [place] [address]  [phone], [address] [place], [place] [address]  [facility]  Individual counseling only  Accepts private insurance only [phone] [address] [place], [place] [address]  [facility]  Methadone maintenance program  [phone] [address] [place], [place] [address]  [facility]  Walk-In Clinic providing substance abuse and mental health counseling  Accepts Medicaid, Medicare, private insurance  Offers sliding scale for uninsured [phone] [address] [place], [place]   [facility], [employer].  Offers individual counseling, and intensive in-home services [phone] [address] [place], [place] [address]  [facility] individual counseling, family counseling, group therapy, domestic violence counseling, consumer credit counseling  Accepts Medicare, Medicaid, private insurance  Offers sliding scale for uninsured [phone] [address] [place], [place] [address]  [phone] [facility], [address] [place], [place] [address]  [facility]  Offers individual, family and group counseling  3 locations - [place], [place], and [place]  [phone]  [address] [place], [place] [address]  [address] [place], [place] [address]  [address] [place], [place] [address]  [facility]    Offers psychiatric assessment, 8-week Intensive Outpatient Program (several hours a day, several times a week, daytime or evenings), early recovery group, family Program, medication management  Private pay or private insurance only [phone], or  [phone] [address] [place], [place] [address]  [place] [employer] individual, couples and family counseling  Accepts Medicaid, private insurance, and sliding scale for uninsured [phone] [address] [place], [place] [address]  Len Blalock, MD  Individual counseling  Private insurance [phone] [address] [place], [place] [address]  [facility]   Offers assessment, substance abuse treatment, and behavioral health treatment [phone] [address] [place], [place] [address]  [facility]  Individual counseling  Accepts private insurance [phone] [address] [place], [place] [address]  [facility]  Individual counseling  Delene Loll, private insurance [phone] [address] [place], [place] [address]  [facility]    Offers intensive outpatient program (several hours a day, several times a week)  Private pay, private insurance [phone] [facility] [place], [place]  [facility]  Individual counseling  Medicare, private insurance [phone] [address] [place], [place] [address]  [facility]    Offers intensive outpatient program (several hours a day, several times a week) and partial hospitalization program [phone] [address] [place], [place] [address]  Emerson Monte, MD  Individual counseling [phone] [address] [place], [place] [address]  [facility]  Offers Christian counseling to individuals, couples, and families  Accepts Medicare and private insurance; offers sliding scale for uninsured [phone] [address] [place], [place] [address]  [facility]  [facility] [phone] [address] [place], [place] [address]  [facility] crisis counseling, individual counseling, group therapy, in-home therapy, domestic violence services, day treatment, DWI services, Administrator, arts (CST), Doctor, hospital (ACTT), substance abuse Intensive Outpatient Program (several hours a day, several times a week)  2 locations - [place] and  [place] [phone] [address] [place], [place] [address]  [phone] [address] [place], [place] [address]  [facility]     Individual counseling and group therapy  Accepts private insurance, [place], [place] [phone] [address] [place], [place]  [facility]  Offers individual and family counseling  Offers LGBTQ services  Accepts private insurance and private pay [phone] [address] [place], [place] [address]  [facility]    Offers individual counseling, group therapy, and outpatient detox  Accepts private insurance [phone] [address] [place], [place]  [facility]  Individual counseling  Accepts Medicare, private insurance [phone] [address] [place], [place] [address]  [facility]  Individual counseling  Accepts Medicare, private insurance [phone] [address] [place], [place] [address]  [facility]   Offers substance abuse Intensive Outpatient Program (several hours a day, several times a week) [phone], or [phone] [place], [place]

## 2016-07-01 NOTE — ED Provider Notes (Signed)
CSN: [id number]     Arrival date & time 07/01/16  0846 History   First MD Initiated Contact with Patient 07/01/16 0900     Chief Complaint  Patient presents with  . Nausea  . Emesis     (Consider location/radiation/quality/duration/timing/severity/associated sxs/prior Treatment) HPI  Vomiting 10 times per day for 1.5 weeks, coffee ground emesis today 6-7 times this AM.  No black or bloody stool.  Diarrhea today x1.  Discomfort in abdomen 6-7/10, diffuse.  No cough, no fevers, no headache.  Has not smoked since emesis began 1.5wks ago.  Tried zofran, reglan, haldol help temporarily thene emesis began again.    Past Medical History  Diagnosis Date  . Anxiety   . GERD (gastroesophageal reflux disease)   . Nausea and vomiting     chronic, recurrent  . Cannabinoid hyperemesis syndrome [facility](HCC)    Past Surgical History  Procedure Laterality Date  . Esophagogastroduodenoscopy  10/31/2014   Family History  Problem Relation Age of Onset  . High blood pressure Maternal Grandmother    Social History  Substance Use Topics  . Smoking status: Never Smoker   . Smokeless tobacco: None  . Alcohol Use: No     Comment: Rarely    Review of Systems  Constitutional: Negative for fever.  HENT: Negative for sore throat.   Eyes: Negative for visual disturbance.  Respiratory: Negative for shortness of breath.   Cardiovascular: Negative for chest pain.  Gastrointestinal: Positive for nausea, vomiting and abdominal pain. Negative for diarrhea and constipation.  Genitourinary: Negative for difficulty urinating.  Musculoskeletal: Negative for back pain and neck stiffness.  Skin: Negative for rash.  Neurological: Negative for syncope and headaches.      Allergies  Lorazepam  Home Medications   Prior to Admission medications   Medication Sig Start Date End Date Taking? Authorizing Provider  metoCLOPramide (REGLAN) 10 MG tablet Take 1 tablet (10 mg total) by mouth every 6 (six)  hours. Patient taking differently: Take 10 mg by mouth every 6 (six) hours as needed for nausea or vomiting.  06/28/16  Yes Bethel BornKelly Marie Gekas, PA-C  ondansetron (ZOFRAN-ODT) 4 MG disintegrating tablet Take 1-2 tablets (4-8 mg total) by mouth every 8 (eight) hours as needed for nausea or vomiting. n/v 07/01/16   Alvira MondayErin Pascuala Klutts, MD  pantoprazole (PROTONIX) 40 MG tablet Take 1 tablet (40 mg total) by mouth 2 (two) times daily. 07/01/16   Alvira MondayErin Tamatha Gadbois, MD  potassium chloride (K-DUR) 10 MEQ tablet Take 1 tablet (10 mEq total) by mouth 2 (two) times daily. 07/01/16 07/03/16  Alvira MondayErin Mckenize Mezera, MD   BP 126/71 mmHg  Pulse 56  Temp(Src) 97.7 F (36.5 C) (Oral)  Resp 18  SpO2 99% Physical Exam  Constitutional: He is oriented to person, place, and time. He appears well-developed and well-nourished. No distress.  HENT:  Head: Normocephalic and atraumatic.  Mouth/Throat: Mucous membranes are dry.  Eyes: Conjunctivae and EOM are normal.  Neck: Normal range of motion.  Cardiovascular: Normal rate, regular rhythm, normal heart sounds and intact distal pulses.  Exam reveals no gallop and no friction rub.   No murmur heard. Pulmonary/Chest: Effort normal and breath sounds normal. No respiratory distress. He has no wheezes. He has no rales.  Abdominal: Soft. He exhibits no distension. There is tenderness (mild epigastric). There is no guarding.  Musculoskeletal: He exhibits no edema.  Neurological: He is alert and oriented to person, place, and time.  Skin: Skin is warm and dry. He is not diaphoretic.  Nursing note and vitals reviewed.   ED Course  Procedures (including critical care time) Labs Review Labs Reviewed  CBC WITH DIFFERENTIAL/PLATELET - Abnormal; Notable for the following:    MCHC 36.9 (*)    Neutro Abs 8.4 (*)    All other components within normal limits  COMPREHENSIVE METABOLIC PANEL - Abnormal; Notable for the following:    Potassium 3.2 (*)    Glucose, Bld 120 (*)    Total Protein 8.2  (*)    Albumin 5.2 (*)    Total Bilirubin 1.5 (*)    All other components within normal limits  URINALYSIS, ROUTINE W REFLEX MICROSCOPIC (NOT AT [facility]) - Abnormal; Notable for the following:    Color, Urine ORANGE (*)    Specific Gravity, Urine 1.039 (*)    Bilirubin Urine SMALL (*)    Ketones, ur >80 (*)    Protein, ur 30 (*)    Leukocytes, UA TRACE (*)    All other components within normal limits  URINE MICROSCOPIC-ADD ON - Abnormal; Notable for the following:    Squamous Epithelial / LPF 0-5 (*)    Bacteria, UA FEW (*)    All other components within normal limits  URINE CULTURE  LIPASE, BLOOD    Imaging Review No results found. I have personally reviewed and evaluated these images and lab results as part of my medical decision-making.   EKG Interpretation None      MDM   Final diagnoses:  Cannabinoid hyperemesis syndrome (HCC)  Non-intractable vomiting with nausea, vomiting of unspecified type  Gastritis    25 year old male with a history of anxiety, GERD, cyclical nausea and vomiting diagnosis Benign hyperemesis syndrome presents with concern for continued nausea, vomiting, and abdominal pain. Patient also notes an episode of coffee-ground emesis today. His abdominal exam is benign and have low suspicion for appendicitis, cholecystitis, diverticulitis. Denies any headache indicate intracranial etiology of emesis. Denies any cough to indicate pneumonia.  Doubt acute GI bleed, no black or bloody stools, no drop in hgb, and suspect erosive gastritis as pt has had in past as etiology of coffee ground emesis. Patient has been seen frequently in the ED in the last week for these symptoms, and describes intractable nausea and vomiting with home despite use of antiemetics. Labs obtained show mild hypokalemia, signs of starvation ketosis on urinalysis. Patient was given IV fluids, Haldol with mild relief of symptoms, and given intractable vomiting at home despite antiemetics, offered  admission versus continued fluids and nausea control in ED.  Pt give another liter of IV saline followed by D5.45NS with KCl and zofran with improvement in nausea and will continue outpt management.  Given rx for zofran, protonix, small rx potassium, and given resources for substance abuse for marijuana use. Patient discharged in stable condition with understanding of reasons to return.    Alvira MondayErin Kallyn Demarcus, MD 07/02/16 1148

## 2016-07-02 LAB — URINE CULTURE: CULTURE: NO GROWTH

## 2016-08-18 ENCOUNTER — Encounter: Payer: Self-pay | Admitting: Family Medicine

## 2017-11-11 ENCOUNTER — Emergency Department (HOSPITAL_COMMUNITY)
Admission: EM | Admit: 2017-11-11 | Discharge: 2017-11-11 | Disposition: A | Discharge status: Home / Self Care | Payer: BLUE CROSS/BLUE SHIELD | Attending: Emergency Medicine | Admitting: Emergency Medicine

## 2017-11-11 ENCOUNTER — Encounter (HOSPITAL_COMMUNITY): Payer: Self-pay | Admitting: Emergency Medicine

## 2017-11-11 DIAGNOSIS — R1115 Cyclical vomiting syndrome unrelated to migraine: Secondary | ICD-10-CM

## 2017-11-11 DIAGNOSIS — R112 Nausea with vomiting, unspecified: Secondary | ICD-10-CM | POA: Diagnosis present

## 2017-11-11 DIAGNOSIS — G43A0 Cyclical vomiting, not intractable: Secondary | ICD-10-CM | POA: Insufficient documentation

## 2017-11-11 LAB — COMPREHENSIVE METABOLIC PANEL
ALT: 17 U/L (ref 17–63)
ANION GAP: 15 (ref 5–15)
AST: 20 U/L (ref 15–41)
Albumin: 5.2 g/dL — ABNORMAL HIGH (ref 3.5–5.0)
Alkaline Phosphatase: 104 U/L (ref 38–126)
BUN: 18 mg/dL (ref 6–20)
CALCIUM: 10 mg/dL (ref 8.9–10.3)
CO2: 20 mmol/L — ABNORMAL LOW (ref 22–32)
CREATININE: 1.19 mg/dL (ref 0.61–1.24)
Chloride: 104 mmol/L (ref 101–111)
Glucose, Bld: 130 mg/dL — ABNORMAL HIGH (ref 65–99)
Potassium: 3.5 mmol/L (ref 3.5–5.1)
SODIUM: 139 mmol/L (ref 135–145)
Total Bilirubin: 1.4 mg/dL — ABNORMAL HIGH (ref 0.3–1.2)
Total Protein: 8.6 g/dL — ABNORMAL HIGH (ref 6.5–8.1)

## 2017-11-11 LAB — CBC
HCT: 46.8 % (ref 39.0–52.0)
Hemoglobin: 16.7 g/dL (ref 13.0–17.0)
MCH: 31.9 pg (ref 26.0–34.0)
MCHC: 35.7 g/dL (ref 30.0–36.0)
MCV: 89.5 fL (ref 78.0–100.0)
PLATELETS: 292 10*3/uL (ref 150–400)
RBC: 5.23 MIL/uL (ref 4.22–5.81)
RDW: 12.7 % (ref 11.5–15.5)
WBC: 9.4 10*3/uL (ref 4.0–10.5)

## 2017-11-11 LAB — LIPASE, BLOOD: LIPASE: 30 U/L (ref 11–51)

## 2017-11-11 MED ORDER — ONDANSETRON HCL 4 MG/2ML IJ SOLN: 4.0000 mg | Freq: Once | INTRAMUSCULAR | Status: DC | PRN

## 2017-11-11 MED ORDER — CAPSAICIN 0.025 % EX CREA
TOPICAL_CREAM | Freq: Two times a day (BID) | CUTANEOUS | Status: DC
  Administered 2017-11-11: 14:00:00 via TOPICAL
  Filled 2017-11-11: qty 60

## 2017-11-11 MED ORDER — ONDANSETRON 4 MG PO TBDP: 4.0000 mg | ORAL_TABLET | Freq: Three times a day (TID) | ORAL | 0 refills | Status: DC | PRN

## 2017-11-11 MED ORDER — SODIUM CHLORIDE 0.9 % IV BOLUS (SEPSIS)
1000.0000 mL | Freq: Once | INTRAVENOUS | Status: AC
  Administered 2017-11-11: 1000 mL via INTRAVENOUS

## 2017-11-11 MED ORDER — METOCLOPRAMIDE HCL 5 MG/ML IJ SOLN
10.0000 mg | Freq: Once | INTRAMUSCULAR | Status: AC
  Administered 2017-11-11: 10 mg via INTRAVENOUS
  Filled 2017-11-11: qty 2

## 2017-11-11 MED ORDER — HALOPERIDOL LACTATE 5 MG/ML IJ SOLN
5.0000 mg | Freq: Once | INTRAMUSCULAR | Status: AC
  Administered 2017-11-11: 5 mg via INTRAVENOUS
  Filled 2017-11-11: qty 1

## 2017-11-11 NOTE — ED Triage Notes (Signed)
Patient c/o n/v with abd pain over past couple days. Denies any diarrhea, constipation or urinary problems.

## 2017-11-11 NOTE — ED Notes (Signed)
Pt has been notified about UA

## 2017-11-11 NOTE — Discharge Instructions (Signed)
It was my pleasure taking care of you today!   Follow up with your primary care doctor to discuss your hospital visit. Continue to hydrate orally with small sips of fluids throughout the day. Use Zofran as directed for nausea & vomiting.   The 'BRAT' diet is suggested, then progress to diet as tolerated as symptoms abate.  Bananas.  Rice.  Applesauce.  Toast (and other simple starches such as crackers, potatoes, noodles).   SEEK IMMEDIATE MEDICAL ATTENTION IF: You begin having localized abdominal pain that does not go away or becomes severe A temperature above 101 develops Repeated vomiting occurs (multiple uncontrollable episodes) or you are unable to keep fluids down Blood is being passed in stools or vomit (bright red or black tarry stools).  New or worsening symptoms, any additional concerns.

## 2017-11-11 NOTE — ED Provider Notes (Signed)
[facility]   CSN: [id number] Arrival date & time: 11/11/17  1232     History   Chief Complaint Chief Complaint  Patient presents with  . Emesis    HPI Peter Suarez is a 26 y.o. male.  The history is provided by the patient and medical records. No language interpreter was used.   Peter Suarez is a 26 y.o. male  with a PMH of gastritis, cannabinoid hyperemesis syndrome who presents to the Emergency Department complaining of nausea and vomiting which began last night. Patient endorses more than 10-15 episodes of vomiting today. Denies abdominal pain, diarrhea, constipation. No medications taken prior to arrival.  No sick contacts.  No recent travel or camping.  No new foods.  Last marijuana use was Saturday or Sunday.    Past Medical History:  Diagnosis Date  . Anxiety   . Cannabinoid hyperemesis syndrome (HCC)   . GERD (gastroesophageal reflux disease)   . Nausea and vomiting    chronic, recurrent    Patient Active Problem List   Diagnosis Date Noted  . Protein-calorie malnutrition, severe (HCC) 01/12/2015  . Intractable nausea and vomiting 01/11/2015  . Erosive gastritis 01/11/2015  . GERD (gastroesophageal reflux disease) 01/11/2015  . Nausea & vomiting 01/11/2015  . Nausea with vomiting 10/30/2014  . Loss of weight 10/30/2014    Past Surgical History:  Procedure Laterality Date  . ESOPHAGOGASTRODUODENOSCOPY  10/31/2014       Home Medications    Prior to Admission medications   Medication Sig Start Date End Date Taking? Authorizing Provider  ondansetron (ZOFRAN ODT) 4 MG disintegrating tablet Take 1 tablet (4 mg total) every 8 (eight) hours as needed by mouth for nausea or vomiting. 11/11/17   Valton Schwartz, Chase PicketJaime Pilcher, PA-C  pantoprazole (PROTONIX) 40 MG tablet Take 1 tablet (40 mg total) by mouth 2 (two) times daily. Patient not taking: Reported on 11/11/2017 07/01/16   Alvira MondaySchlossman, Erin, MD  potassium  chloride (K-DUR) 10 MEQ tablet Take 1 tablet (10 mEq total) by mouth 2 (two) times daily. 07/01/16 07/03/16  Alvira MondaySchlossman, Erin, MD    Family History Family History  Problem Relation Age of Onset  . High blood pressure Maternal Grandmother     Social History Social History   Tobacco Use  . Smoking status: Never Smoker  . Smokeless tobacco: Never Used  Substance Use Topics  . Alcohol use: No    Alcohol/week: 0.0 oz    Comment: Rarely  . Drug use: Yes    Frequency: 7.0 times per week    Types: Marijuana    Comment: x1 week ago     Allergies   Lorazepam   Review of Systems Review of Systems  Gastrointestinal: Positive for nausea and vomiting. Negative for abdominal pain, blood in stool, constipation and diarrhea.  All other systems reviewed and are negative.    Physical Exam Updated Vital Signs BP 125/79 (BP Location: Left Arm)   Pulse 69   Temp 98.1 F (36.7 C) (Oral)   Resp 16   Ht 6\' 2"  (1.88 m)   SpO2 100%   BMI 22.21 kg/m   Physical Exam  Constitutional: He is oriented to person, place, and time. He appears well-developed and well-nourished. No distress.  HENT:  Head: Normocephalic and atraumatic.  Cardiovascular: Normal rate, regular rhythm and normal heart sounds.  No murmur heard. Pulmonary/Chest: Effort normal and breath sounds normal. No respiratory distress.  Abdominal: Soft. He exhibits no distension.  Diffuse  generalized abdominal tenderness without focality.  No tenderness at McBurney's.  Negative Murphy's.  Musculoskeletal: He exhibits no edema.  Neurological: He is alert and oriented to person, place, and time.  Skin: Skin is warm and dry.  Nursing note and vitals reviewed.    ED Treatments / Results  Labs (all labs ordered are listed, but only abnormal results are displayed) Labs Reviewed  COMPREHENSIVE METABOLIC PANEL - Abnormal; Notable for the following components:      Result Value   CO2 20 (*)    Glucose, Bld 130 (*)    Total  Protein 8.6 (*)    Albumin 5.2 (*)    Total Bilirubin 1.4 (*)    All other components within normal limits  LIPASE, BLOOD  CBC  URINALYSIS, ROUTINE W REFLEX MICROSCOPIC    EKG  EKG Interpretation None       Radiology No results found.  Procedures Procedures (including critical care time)  Medications Ordered in ED Medications  capsaicin (ZOSTRIX) 0.025 % cream ( Topical Given 11/11/17 1352)  sodium chloride 0.9 % bolus 1,000 mL (0 mLs Intravenous Stopped 11/11/17 1435)  haloperidol lactate (HALDOL) injection 5 mg (5 mg Intravenous Given 11/11/17 1333)  metoCLOPramide (REGLAN) injection 10 mg (10 mg Intravenous Given 11/11/17 1435)     Initial Impression / Assessment and Plan / ED Course  I have reviewed the triage vital signs and the nursing notes.  Pertinent labs & imaging results that were available during my care of the patient were reviewed by me and considered in my medical decision making (see chart for details).    Peter Suarez is a 26 y.o. male who presents to ED for nausea and vomiting which began last night. Hx of similar thought to be 2/2 cannabis use. Patient does endorse continuing to smoke marijuana. Afebrile, hemodynamically stable with no peritoneal signs on abdominal exam. Labs reviewed and reassuring. Patient feels improved following medications in ED. Tolerating PO with no emesis since medication administration. Evaluation does not show pathology that would require ongoing emergent intervention or inpatient treatment. Strongly encouraged marijuana cessation and PCP follow up. Reasons to return to ER discussed. All questions answered.   Final Clinical Impressions(s) / ED Diagnoses   Final diagnoses:  Non-intractable cyclical vomiting with nausea    ED Discharge Orders        Ordered    ondansetron (ZOFRAN ODT) 4 MG disintegrating tablet  Every 8 hours PRN     11/11/17 1533       Taline Nass, Chase PicketJaime Pilcher, PA-C 11/11/17 1538    Linwood DibblesKnapp, Jon,  MD 11/12/17 [phone]

## 2017-11-13 DIAGNOSIS — R112 Nausea with vomiting, unspecified: Secondary | ICD-10-CM | POA: Diagnosis present

## 2017-11-13 DIAGNOSIS — K2900 Acute gastritis without bleeding: Secondary | ICD-10-CM | POA: Insufficient documentation

## 2017-11-14 ENCOUNTER — Encounter (HOSPITAL_COMMUNITY): Payer: Self-pay | Admitting: Emergency Medicine

## 2017-11-14 ENCOUNTER — Other Ambulatory Visit: Payer: Self-pay

## 2017-11-14 ENCOUNTER — Emergency Department (HOSPITAL_COMMUNITY)
Admission: EM | Admit: 2017-11-14 | Discharge: 2017-11-14 | Disposition: A | Discharge status: Home / Self Care | Payer: BLUE CROSS/BLUE SHIELD | Attending: Emergency Medicine | Admitting: Emergency Medicine

## 2017-11-14 ENCOUNTER — Emergency Department (HOSPITAL_COMMUNITY): Payer: BLUE CROSS/BLUE SHIELD

## 2017-11-14 DIAGNOSIS — R112 Nausea with vomiting, unspecified: Secondary | ICD-10-CM

## 2017-11-14 DIAGNOSIS — K2900 Acute gastritis without bleeding: Secondary | ICD-10-CM

## 2017-11-14 LAB — COMPREHENSIVE METABOLIC PANEL
ALT: 17 U/L (ref 17–63)
AST: 19 U/L (ref 15–41)
Albumin: 5.5 g/dL — ABNORMAL HIGH (ref 3.5–5.0)
Alkaline Phosphatase: 102 U/L (ref 38–126)
Anion gap: 14 (ref 5–15)
BILIRUBIN TOTAL: 1.5 mg/dL — AB (ref 0.3–1.2)
BUN: 13 mg/dL (ref 6–20)
CHLORIDE: 104 mmol/L (ref 101–111)
CO2: 23 mmol/L (ref 22–32)
CREATININE: 1.09 mg/dL (ref 0.61–1.24)
Calcium: 10.1 mg/dL (ref 8.9–10.3)
Glucose, Bld: 124 mg/dL — ABNORMAL HIGH (ref 65–99)
Potassium: 3.1 mmol/L — ABNORMAL LOW (ref 3.5–5.1)
Sodium: 141 mmol/L (ref 135–145)
TOTAL PROTEIN: 8.8 g/dL — AB (ref 6.5–8.1)

## 2017-11-14 LAB — CBC
HCT: 46.6 % (ref 39.0–52.0)
Hemoglobin: 16.8 g/dL (ref 13.0–17.0)
MCH: 32 pg (ref 26.0–34.0)
MCHC: 36.1 g/dL — ABNORMAL HIGH (ref 30.0–36.0)
MCV: 88.8 fL (ref 78.0–100.0)
PLATELETS: 290 10*3/uL (ref 150–400)
RBC: 5.25 MIL/uL (ref 4.22–5.81)
RDW: 12.7 % (ref 11.5–15.5)
WBC: 10.4 10*3/uL (ref 4.0–10.5)

## 2017-11-14 LAB — LIPASE, BLOOD: Lipase: 28 U/L (ref 11–51)

## 2017-11-14 MED ORDER — PANTOPRAZOLE SODIUM 40 MG IV SOLR
40.0000 mg | Freq: Once | INTRAVENOUS | Status: AC
  Administered 2017-11-14: 40 mg via INTRAVENOUS
  Filled 2017-11-14: qty 40

## 2017-11-14 MED ORDER — ONDANSETRON HCL 4 MG/2ML IJ SOLN
4.0000 mg | Freq: Once | INTRAMUSCULAR | Status: AC | PRN
  Administered 2017-11-14: 4 mg via INTRAVENOUS
  Filled 2017-11-14: qty 2

## 2017-11-14 MED ORDER — OMEPRAZOLE 20 MG PO CPDR: 20.0000 mg | DELAYED_RELEASE_CAPSULE | Freq: Every day | ORAL | 0 refills | Status: DC

## 2017-11-14 MED ORDER — SODIUM CHLORIDE 0.9 % IV BOLUS (SEPSIS)
1000.0000 mL | Freq: Once | INTRAVENOUS | Status: AC
  Administered 2017-11-14: 1000 mL via INTRAVENOUS

## 2017-11-14 MED ORDER — PROMETHAZINE HCL 25 MG PO TABS: 25.0000 mg | ORAL_TABLET | Freq: Four times a day (QID) | ORAL | 0 refills | Status: DC | PRN

## 2017-11-14 MED ORDER — ONDANSETRON 4 MG PO TBDP: 4.0000 mg | ORAL_TABLET | Freq: Once | ORAL | Status: DC | PRN

## 2017-11-14 MED ORDER — GI COCKTAIL ~~LOC~~
30.0000 mL | Freq: Once | ORAL | Status: AC
  Administered 2017-11-14: 30 mL via ORAL
  Filled 2017-11-14: qty 30

## 2017-11-14 MED ORDER — HALOPERIDOL LACTATE 5 MG/ML IJ SOLN
2.0000 mg | Freq: Once | INTRAMUSCULAR | Status: AC
  Administered 2017-11-14: 2 mg via INTRAVENOUS
  Filled 2017-11-14: qty 1

## 2017-11-14 NOTE — ED Notes (Signed)
Pt. Unable to give urine specimen at this time. 

## 2017-11-14 NOTE — ED Triage Notes (Signed)
Patient complaining of nausea and vomiting. Patient was seen on 11/11/2017 for this same symptoms. Patient states the last time he had weed was last Saturday(11/06/2017). Patient states that after he got home from the 11/15 visit he became sick again and has not had any relief since. Patient states he does not have any diarrhea. Patient states he has discomfort in his abdomen and throat from the vomiting but no pain.

## 2017-11-14 NOTE — Discharge Instructions (Signed)
You were seen today for nausea and vomiting.  This may be related to gastritis or an ulcer.  It could also be related to marijuana use.  Follow-up with gastroenterology for repeat endoscopy if symptoms persist.

## 2017-11-14 NOTE — ED Provider Notes (Signed)
[facility]   CSN: [id number] Arrival date & time: 11/13/17  2323     History   Chief Complaint Chief Complaint  Patient presents with  . Nausea  . Hyperemesis    HPI Peter Suarez is a 26 y.o. male.  HPI  This is a 26 year old male with a history of cannabinoids hyperemesis, reflux, chronic nausea and vomiting who presents with persistent nausea and vomiting.  Patient reports onset of symptoms on Tuesday.  He was seen and evaluated on Thursday in the ED.  Workup was reassuring.  He was given fluids and discharged home.  Patient reports recurrent emesis.  Not improved with Zofran.  Describes his emesis as coffee-ground.  Denies dark tarry stools or bloody stools.  Denies abdominal pain.  Last bowel movement was on Wednesday.  Patient states that he last smoked marijuana on Saturday.  Denies chest pain, shortness of breath.  Past Medical History:  Diagnosis Date  . Anxiety   . Cannabinoid hyperemesis syndrome (HCC)   . GERD (gastroesophageal reflux disease)   . Nausea and vomiting    chronic, recurrent    Patient Active Problem List   Diagnosis Date Noted  . Protein-calorie malnutrition, severe (HCC) 01/12/2015  . Intractable nausea and vomiting 01/11/2015  . Erosive gastritis 01/11/2015  . GERD (gastroesophageal reflux disease) 01/11/2015  . Nausea & vomiting 01/11/2015  . Nausea with vomiting 10/30/2014  . Loss of weight 10/30/2014    Past Surgical History:  Procedure Laterality Date  . ESOPHAGOGASTRODUODENOSCOPY  10/31/2014       Home Medications    Prior to Admission medications   Medication Sig Start Date End Date Taking? Authorizing Provider  ondansetron (ZOFRAN ODT) 4 MG disintegrating tablet Take 1 tablet (4 mg total) every 8 (eight) hours as needed by mouth for nausea or vomiting. 11/11/17  Yes Ward, Chase PicketJaime Pilcher, PA-C  omeprazole (PRILOSEC) 20 MG capsule Take 1 capsule (20 mg total) daily by mouth.  11/14/17   Kameisha Malicki, Mayer Maskerourtney F, MD  pantoprazole (PROTONIX) 40 MG tablet Take 1 tablet (40 mg total) by mouth 2 (two) times daily. Patient not taking: Reported on 11/11/2017 07/01/16   Alvira MondaySchlossman, Erin, MD  promethazine (PHENERGAN) 25 MG tablet Take 1 tablet (25 mg total) every 6 (six) hours as needed by mouth for nausea or vomiting. 11/14/17   Kartel Wolbert, Mayer Maskerourtney F, MD    Family History Family History  Problem Relation Age of Onset  . High blood pressure Maternal Grandmother     Social History Social History   Tobacco Use  . Smoking status: Never Smoker  . Smokeless tobacco: Never Used  Substance Use Topics  . Alcohol use: No    Alcohol/week: 0.0 oz    Comment: Rarely  . Drug use: Yes    Frequency: 7.0 times per week    Types: Marijuana    Comment: x1 week ago     Allergies   Lorazepam   Review of Systems Review of Systems  Constitutional: Negative for fever.  Respiratory: Negative for shortness of breath.   Cardiovascular: Negative for chest pain.  Gastrointestinal: Positive for nausea and vomiting. Negative for abdominal pain, blood in stool, constipation and diarrhea.  All other systems reviewed and are negative.    Physical Exam Updated Vital Signs BP 130/85   Pulse (!) 58   Temp 97.9 F (36.6 C) (Oral)   Resp 18   Ht 6\' 2"  (1.88 m)   Wt 88.5 kg (195 lb)  SpO2 96%   BMI 25.04 kg/m   Physical Exam  Constitutional: He is oriented to person, place, and time. He appears well-developed and well-nourished. No distress.  HENT:  Head: Normocephalic and atraumatic.  Mucous membranes dry  Cardiovascular: Normal rate, regular rhythm and normal heart sounds.  No murmur heard. Pulmonary/Chest: Effort normal and breath sounds normal. No respiratory distress. He has no wheezes.  Abdominal: Soft. Bowel sounds are normal. There is no tenderness. There is no rebound and no guarding.  Musculoskeletal: He exhibits no edema.  Neurological: He is alert and oriented to  person, place, and time.  Skin: Skin is warm and dry.  Psychiatric: He has a normal mood and affect.  Nursing note and vitals reviewed.    ED Treatments / Results  Labs (all labs ordered are listed, but only abnormal results are displayed) Labs Reviewed  COMPREHENSIVE METABOLIC PANEL - Abnormal; Notable for the following components:      Result Value   Potassium 3.1 (*)    Glucose, Bld 124 (*)    Total Protein 8.8 (*)    Albumin 5.5 (*)    Total Bilirubin 1.5 (*)    All other components within normal limits  CBC - Abnormal; Notable for the following components:   MCHC 36.1 (*)    All other components within normal limits  LIPASE, BLOOD  URINALYSIS, ROUTINE W REFLEX MICROSCOPIC    EKG  EKG Interpretation None       Radiology Dg Abdomen Acute W/chest  Result Date: 11/14/2017 CLINICAL DATA:  Acute onset of nausea and vomiting.  Abdominal pain. EXAM: DG ABDOMEN ACUTE W/ 1V CHEST COMPARISON:  Abdominal ultrasound performed 12/13/2014 FINDINGS: The lungs are well-aerated and clear. There is no evidence of focal opacification, pleural effusion or pneumothorax. The cardiomediastinal silhouette is within normal limits. There is mild apparent wall thickening along a small bowel loop at the mid abdomen. Scattered stool and air are seen within the colon; there is no evidence of small bowel dilatation to suggest obstruction. No free intra-abdominal air is identified on the provided upright view. No acute osseous abnormalities are seen; the sacroiliac joints are unremarkable in appearance. IMPRESSION: 1. Mild apparent wall thickening along a small bowel loop at the mid abdomen. This could reflect mild ileitis. No evidence for bowel obstruction. 2. No acute cardiopulmonary process seen. Electronically Signed   By: Roanna Raider M.D.   On: 11/14/2017 02:29    Procedures Procedures (including critical care time)  Medications Ordered in ED Medications  ondansetron (ZOFRAN-ODT)  disintegrating tablet 4 mg (not administered)  ondansetron (ZOFRAN) injection 4 mg (4 mg Intravenous Given 11/14/17 0031)  haloperidol lactate (HALDOL) injection 2 mg (2 mg Intravenous Given 11/14/17 0138)  sodium chloride 0.9 % bolus 1,000 mL (0 mLs Intravenous Stopped 11/14/17 0252)  pantoprazole (PROTONIX) injection 40 mg (40 mg Intravenous Given 11/14/17 0136)  gi cocktail (Maalox,Lidocaine,Donnatal) (30 mLs Oral Given 11/14/17 0252)     Initial Impression / Assessment and Plan / ED Course  I have reviewed the triage vital signs and the nursing notes.  Pertinent labs & imaging results that were available during my care of the patient were reviewed by me and considered in my medical decision making (see chart for details).     Patient presents with nausea vomiting.  Persistent since Tuesday.  History of the same.  He does appear dry on exam.  No abdominal tenderness.  Basic lab work is reassuring.  Acute abdominal series just shows some thickening  of the small bowel wall suggestive of inflammation versus ileitis.  Given his history, would be more concerned for peptic ulcer versus gastritis.  His temporal relationship with marijuana use if he is being truthful, not consistent with hyperemesis; however, he does have a history of this.  He is able to tolerate fluids after treatment.  Will provide a prescription for Phenergan and omeprazole.  Recommend follow-up with gastroenterology for repeat endoscopy.  After history, exam, and medical workup I feel the patient has been appropriately medically screened and is safe for discharge home. Pertinent diagnoses were discussed with the patient. Patient was given return precautions.   Final Clinical Impressions(s) / ED Diagnoses   Final diagnoses:  Non-intractable vomiting with nausea, unspecified vomiting type  Acute gastritis without hemorrhage, unspecified gastritis type    ED Discharge Orders        Ordered    omeprazole (PRILOSEC) 20 MG  capsule  Daily     11/14/17 0328    promethazine (PHENERGAN) 25 MG tablet  Every 6 hours PRN     11/14/17 0328       Shon BatonHorton, Antion Andres F, MD 11/14/17 [phone]

## 2018-07-18 ENCOUNTER — Encounter (HOSPITAL_COMMUNITY): Payer: Self-pay | Admitting: Emergency Medicine

## 2018-07-18 ENCOUNTER — Emergency Department (HOSPITAL_COMMUNITY)
Admission: EM | Admit: 2018-07-18 | Discharge: 2018-07-18 | Disposition: A | Discharge status: Home / Self Care | Payer: BLUE CROSS/BLUE SHIELD | Attending: Emergency Medicine | Admitting: Emergency Medicine

## 2018-07-18 DIAGNOSIS — R1115 Cyclical vomiting syndrome unrelated to migraine: Secondary | ICD-10-CM

## 2018-07-18 DIAGNOSIS — G43A0 Cyclical vomiting, not intractable: Secondary | ICD-10-CM | POA: Insufficient documentation

## 2018-07-18 DIAGNOSIS — Z79899 Other long term (current) drug therapy: Secondary | ICD-10-CM | POA: Diagnosis not present

## 2018-07-18 DIAGNOSIS — R112 Nausea with vomiting, unspecified: Secondary | ICD-10-CM | POA: Diagnosis present

## 2018-07-18 LAB — COMPREHENSIVE METABOLIC PANEL
ALK PHOS: 70 U/L (ref 38–126)
ALT: 28 U/L (ref 0–44)
ANION GAP: 17 — AB (ref 5–15)
AST: 33 U/L (ref 15–41)
Albumin: 5.6 g/dL — ABNORMAL HIGH (ref 3.5–5.0)
BILIRUBIN TOTAL: 1.6 mg/dL — AB (ref 0.3–1.2)
BUN: 19 mg/dL (ref 6–20)
CALCIUM: 10.6 mg/dL — AB (ref 8.9–10.3)
CO2: 30 mmol/L (ref 22–32)
Chloride: 94 mmol/L — ABNORMAL LOW (ref 98–111)
Creatinine, Ser: 1.02 mg/dL (ref 0.61–1.24)
GFR calc non Af Amer: >60 mL/min (ref >60)
Glucose, Bld: 141 mg/dL — ABNORMAL HIGH (ref 70–99)
Potassium: 2.8 mmol/L — ABNORMAL LOW (ref 3.5–5.1)
Sodium: 141 mmol/L (ref 135–145)
TOTAL PROTEIN: 9.1 g/dL — AB (ref 6.5–8.1)

## 2018-07-18 LAB — URINALYSIS, ROUTINE W REFLEX MICROSCOPIC
Glucose, UA: NEGATIVE mg/dL
Hgb urine dipstick: NEGATIVE
Ketones, ur: NEGATIVE mg/dL
Leukocytes, UA: NEGATIVE
Nitrite: NEGATIVE
PROTEIN: 100 mg/dL — AB
SPECIFIC GRAVITY, URINE: 1.033 — AB (ref 1.005–1.030)
pH: 5 (ref 5.0–8.0)

## 2018-07-18 LAB — LIPASE, BLOOD: Lipase: 30 U/L (ref 11–51)

## 2018-07-18 LAB — CBC
HEMATOCRIT: 49.6 % (ref 39.0–52.0)
Hemoglobin: 17.6 g/dL — ABNORMAL HIGH (ref 13.0–17.0)
MCH: 32.1 pg (ref 26.0–34.0)
MCHC: 35.5 g/dL (ref 30.0–36.0)
MCV: 90.3 fL (ref 78.0–100.0)
Platelets: 289 10*3/uL (ref 150–400)
RBC: 5.49 MIL/uL (ref 4.22–5.81)
RDW: 12.8 % (ref 11.5–15.5)
WBC: 12.7 10*3/uL — ABNORMAL HIGH (ref 4.0–10.5)

## 2018-07-18 MED ORDER — CAPSAICIN 0.025 % EX CREA
TOPICAL_CREAM | Freq: Two times a day (BID) | CUTANEOUS | Status: DC
  Administered 2018-07-18: 11:00:00 via TOPICAL
  Filled 2018-07-18: qty 60

## 2018-07-18 MED ORDER — PANTOPRAZOLE SODIUM 40 MG PO TBEC: 40.0000 mg | DELAYED_RELEASE_TABLET | Freq: Two times a day (BID) | ORAL | 0 refills | Status: DC

## 2018-07-18 MED ORDER — ONDANSETRON HCL 4 MG/2ML IJ SOLN
4.0000 mg | Freq: Once | INTRAMUSCULAR | Status: AC
  Administered 2018-07-18: 4 mg via INTRAVENOUS
  Filled 2018-07-18: qty 2

## 2018-07-18 MED ORDER — PROMETHAZINE HCL 25 MG PO TABS: 25.0000 mg | ORAL_TABLET | Freq: Four times a day (QID) | ORAL | 0 refills | Status: DC | PRN

## 2018-07-18 MED ORDER — SODIUM CHLORIDE 0.9 % IV BOLUS (SEPSIS)
1000.0000 mL | Freq: Once | INTRAVENOUS | Status: AC
  Administered 2018-07-18: 1000 mL via INTRAVENOUS

## 2018-07-18 MED ORDER — POTASSIUM CHLORIDE 10 MEQ/100ML IV SOLN
10.0000 meq | Freq: Once | INTRAVENOUS | Status: AC
  Administered 2018-07-18: 10 meq via INTRAVENOUS
  Filled 2018-07-18: qty 100

## 2018-07-18 MED ORDER — POTASSIUM CHLORIDE CRYS ER 20 MEQ PO TBCR
40.0000 meq | EXTENDED_RELEASE_TABLET | Freq: Once | ORAL | Status: AC
  Administered 2018-07-18: 40 meq via ORAL
  Filled 2018-07-18: qty 2

## 2018-07-18 MED ORDER — SODIUM CHLORIDE 0.9 % IV SOLN
1000.0000 mL | INTRAVENOUS | Status: DC
  Administered 2018-07-18: 1000 mL via INTRAVENOUS

## 2018-07-18 MED ORDER — HALOPERIDOL LACTATE 5 MG/ML IJ SOLN
5.0000 mg | Freq: Once | INTRAMUSCULAR | Status: AC
  Administered 2018-07-18: 5 mg via INTRAVENOUS
  Filled 2018-07-18: qty 1

## 2018-07-18 NOTE — ED Triage Notes (Signed)
Pt c/o abd pains with n/v since Wed. Reports 10-15 episodes per day. Reports been seen here before with cannabis induced hyperemesis

## 2018-07-18 NOTE — ED Provider Notes (Signed)
[facility] Provider Note   CSN: [id number] Arrival date & time: 07/18/18  8657     History   Chief Complaint Chief Complaint  Patient presents with  . Emesis  . Nausea    HPI Peter Suarez is a 27 y.o. male.  The history is provided by the patient. No language interpreter was used.  Emesis       27 year old male with known history cannabinol hyperemesis syndrome, GERD, marijuana use presenting for evaluation of nausea and vomiting.  Patient report persistent nausea vomiting ongoing for the past for 5 days.  States he vomited 10-15 episodes of brown coffee colored emesis daily with abdominal discomfort.  Symptom is persistent, nothing seems to make it better even with warm shower.  Symptoms felt very similar to significant history of cannabinol hyperemesis syndrome.  He did admits to using marijuana consecutively for over a week after his [birth year] on [birth year].  He denies any associated fever, chest pain, shortness of breath, black tarry stools.  Past Medical History:  Diagnosis Date  . Anxiety   . Cannabinoid hyperemesis syndrome (HCC)   . GERD (gastroesophageal reflux disease)   . Nausea and vomiting    chronic, recurrent    Patient Active Problem List   Diagnosis Date Noted  . Protein-calorie malnutrition, severe (HCC) 01/12/2015  . Intractable nausea and vomiting 01/11/2015  . Erosive gastritis 01/11/2015  . GERD (gastroesophageal reflux disease) 01/11/2015  . Nausea & vomiting 01/11/2015  . Nausea with vomiting 10/30/2014  . Loss of weight 10/30/2014    Past Surgical History:  Procedure Laterality Date  . ESOPHAGOGASTRODUODENOSCOPY  10/31/2014        Home Medications    Prior to Admission medications   Medication Sig Start Date End Date Taking? Authorizing Provider  omeprazole (PRILOSEC) 20 MG capsule Take 1 capsule (20 mg total) daily by mouth. 11/14/17   Horton, Mayer Masker, MD  ondansetron (ZOFRAN ODT) 4 MG  disintegrating tablet Take 1 tablet (4 mg total) every 8 (eight) hours as needed by mouth for nausea or vomiting. 11/11/17   Ward, Chase Picket, PA-C  pantoprazole (PROTONIX) 40 MG tablet Take 1 tablet (40 mg total) by mouth 2 (two) times daily. Patient not taking: Reported on 11/11/2017 07/01/16   Alvira Monday, MD  promethazine (PHENERGAN) 25 MG tablet Take 1 tablet (25 mg total) every 6 (six) hours as needed by mouth for nausea or vomiting. 11/14/17   Horton, Mayer Masker, MD  haloperidol (HALDOL) 2 MG tablet Take 2 mg by mouth daily. 06/30/16-07/04/16 06/29/16 07/01/16  [provider]  sucralfate (CARAFATE) 1 GM/10ML suspension Take 10 mLs (1 g total) by mouth 4 (four) times daily -  with meals and at bedtime. Patient not taking: Reported on 05/19/2015 01/15/15 05/19/15  Richarda Overlie, MD    Family History Family History  Problem Relation Age of Onset  . High blood pressure Maternal Grandmother     Social History Social History   Tobacco Use  . Smoking status: Never Smoker  . Smokeless tobacco: Never Used  Substance Use Topics  . Alcohol use: No    Alcohol/week: 0.0 oz    Comment: Rarely  . Drug use: Yes    Frequency: 7.0 times per week    Types: Marijuana     Allergies   Lorazepam   Review of Systems Review of Systems  Gastrointestinal: Positive for vomiting.  All other systems reviewed and are negative.    Physical Exam Updated Vital  Signs BP 122/82 (BP Location: Right Arm)   Pulse 68   Temp 98.3 F (36.8 C) (Oral)   Resp 16   Ht 6\' 1"  (1.854 m)   SpO2 98%   BMI 25.73 kg/m   Physical Exam  Constitutional: He appears well-developed and well-nourished. No distress.  HENT:  Head: Atraumatic.  Eyes: Conjunctivae are normal.  Neck: Neck supple.  Cardiovascular: Normal rate and regular rhythm.  Pulmonary/Chest: Effort normal and breath sounds normal.  Abdominal: Soft. He exhibits no distension. There is tenderness (Epigastric tenderness without guarding or  rebound tenderness).  Neurological: He is alert.  Skin: No rash noted.  Psychiatric: He has a normal mood and affect.  Nursing note and vitals reviewed.    ED Treatments / Results  Labs (all labs ordered are listed, but only abnormal results are displayed) Labs Reviewed  COMPREHENSIVE METABOLIC PANEL - Abnormal; Notable for the following components:      Result Value   Potassium 2.8 (*)    Chloride 94 (*)    Glucose, Bld 141 (*)    Calcium 10.6 (*)    Total Protein 9.1 (*)    Albumin 5.6 (*)    Total Bilirubin 1.6 (*)    Anion gap 17 (*)    All other components within normal limits  CBC - Abnormal; Notable for the following components:   WBC 12.7 (*)    Hemoglobin 17.6 (*)    All other components within normal limits  URINALYSIS, ROUTINE W REFLEX MICROSCOPIC - Abnormal; Notable for the following components:   Color, Urine AMBER (*)    Specific Gravity, Urine 1.033 (*)    Bilirubin Urine SMALL (*)    Protein, ur 100 (*)    Bacteria, UA RARE (*)    All other components within normal limits  LIPASE, BLOOD    EKG None  Radiology No results found.  Procedures Procedures (including critical care time)  Medications Ordered in ED Medications  sodium chloride 0.9 % bolus 1,000 mL (0 mLs Intravenous Stopped 07/18/18 1141)    Followed by  sodium chloride 0.9 % bolus 1,000 mL (0 mLs Intravenous Stopped 07/18/18 1141)    Followed by  0.9 %  sodium chloride infusion (1,000 mLs Intravenous New Bag/Given 07/18/18 1147)  capsaicin (ZOSTRIX) 0.025 % cream ( Topical Given 07/18/18 1055)  haloperidol lactate (HALDOL) injection 5 mg (5 mg Intravenous Given 07/18/18 1050)  ondansetron (ZOFRAN) injection 4 mg (4 mg Intravenous Given 07/18/18 1049)  potassium chloride SA (K-DUR,KLOR-CON) CR tablet 40 mEq (40 mEq Oral Given 07/18/18 1146)  potassium chloride 10 mEq in 100 mL IVPB (0 mEq Intravenous Stopped 07/18/18 1246)     Initial Impression / Assessment and Plan / ED Course  I have  reviewed the triage vital signs and the nursing notes.  Pertinent labs & imaging results that were available during my care of the patient were reviewed by me and considered in my medical decision making (see chart for details).     BP 107/73   Pulse (!) 49   Temp 98.6 F (37 C) (Oral)   Resp 15   Ht 6\' 1"  (1.854 m)   SpO2 98%   BMI 25.73 kg/m    Final Clinical Impressions(s) / ED Diagnoses   Final diagnoses:  Non-intractable cyclical vomiting with nausea    ED Discharge Orders        Ordered    pantoprazole (PROTONIX) 40 MG tablet  2 times daily     07/18/18 1349  promethazine (PHENERGAN) 25 MG tablet  Every 6 hours PRN     07/18/18 1349     10:26 AM Patient with extensive history of nausea vomiting related to cannabinoid hyperemesis syndrome who admits to using marijuana recently and here with the same complaint.  He has had excessive work-up for this in the past.  I believe his symptom is related to marijuana use.  Will provide symptomatic treatment which will include IV fluid, Haldol, Zofran, and capsaicin cream.  1:50 PM sxs improves with current treatment.  Pt found to be hypokalemic, K+ 2.8 likely 2/2 persistent vomiting.  He received supplementation and tolerates well.  He was also given Capsaicin cream to apply to abdomen but unable to tolerates the burning sensation.  Pt d/c home with Protonix for gastritis as well as antinausea medication.     Fayrene Helperran, Wylene Weissman, PA-C 07/18/18 1352    Cathren LaineSteinl, Kevin, MD 07/18/18 [phone]

## 2018-07-18 NOTE — ED Notes (Signed)
Discharge instructions reviewed with patient. Patient verbalizes understanding. VSS. PIV removed.

## 2019-01-30 ENCOUNTER — Emergency Department (HOSPITAL_COMMUNITY): Payer: BLUE CROSS/BLUE SHIELD

## 2019-01-30 ENCOUNTER — Other Ambulatory Visit: Payer: Self-pay

## 2019-01-30 ENCOUNTER — Encounter (HOSPITAL_COMMUNITY): Payer: Self-pay | Admitting: Emergency Medicine

## 2019-01-30 ENCOUNTER — Emergency Department (HOSPITAL_COMMUNITY)
Admission: EM | Admit: 2019-01-30 | Discharge: 2019-01-30 | Disposition: A | Discharge status: Home / Self Care | Payer: BLUE CROSS/BLUE SHIELD | Attending: Emergency Medicine | Admitting: Emergency Medicine

## 2019-01-30 DIAGNOSIS — F12188 Cannabis abuse with other cannabis-induced disorder: Secondary | ICD-10-CM | POA: Diagnosis not present

## 2019-01-30 DIAGNOSIS — R1110 Vomiting, unspecified: Secondary | ICD-10-CM | POA: Diagnosis present

## 2019-01-30 DIAGNOSIS — E876 Hypokalemia: Secondary | ICD-10-CM

## 2019-01-30 LAB — CBC WITH DIFFERENTIAL/PLATELET
ABS IMMATURE GRANULOCYTES: 0.09 10*3/uL — AB (ref 0.00–0.07)
BASOS PCT: 0 %
Basophils Absolute: 0 10*3/uL (ref 0.0–0.1)
Eosinophils Absolute: 0 10*3/uL (ref 0.0–0.5)
Eosinophils Relative: 0 %
HCT: 48.7 % (ref 39.0–52.0)
Hemoglobin: 17.2 g/dL — ABNORMAL HIGH (ref 13.0–17.0)
Immature Granulocytes: 1 %
LYMPHS ABS: 0.6 10*3/uL — AB (ref 0.7–4.0)
Lymphocytes Relative: 5 %
MCH: 31 pg (ref 26.0–34.0)
MCHC: 35.3 g/dL (ref 30.0–36.0)
MCV: 87.7 fL (ref 80.0–100.0)
MONO ABS: 1.4 10*3/uL — AB (ref 0.1–1.0)
Monocytes Relative: 10 %
NEUTROS ABS: 11.9 10*3/uL — AB (ref 1.7–7.7)
Neutrophils Relative %: 84 %
Platelets: 311 10*3/uL (ref 150–400)
RBC: 5.55 MIL/uL (ref 4.22–5.81)
RDW: 11.7 % (ref 11.5–15.5)
WBC: 14.1 10*3/uL — ABNORMAL HIGH (ref 4.0–10.5)
nRBC: 0 % (ref 0.0–0.2)

## 2019-01-30 LAB — COMPREHENSIVE METABOLIC PANEL
ALBUMIN: 5.8 g/dL — AB (ref 3.5–5.0)
ALT: 19 U/L (ref 0–44)
AST: 21 U/L (ref 15–41)
Alkaline Phosphatase: 73 U/L (ref 38–126)
Anion gap: 17 — ABNORMAL HIGH (ref 5–15)
BILIRUBIN TOTAL: 1.2 mg/dL (ref 0.3–1.2)
BUN: 32 mg/dL — AB (ref 6–20)
CO2: 25 mmol/L (ref 22–32)
Calcium: 10.4 mg/dL — ABNORMAL HIGH (ref 8.9–10.3)
Chloride: 97 mmol/L — ABNORMAL LOW (ref 98–111)
Creatinine, Ser: 1.47 mg/dL — ABNORMAL HIGH (ref 0.61–1.24)
GFR calc Af Amer: >60 mL/min (ref >60)
GFR calc non Af Amer: >60 mL/min (ref >60)
GLUCOSE: 189 mg/dL — AB (ref 70–99)
POTASSIUM: 3.4 mmol/L — AB (ref 3.5–5.1)
Sodium: 139 mmol/L (ref 135–145)
TOTAL PROTEIN: 9.6 g/dL — AB (ref 6.5–8.1)

## 2019-01-30 LAB — LIPASE, BLOOD: Lipase: 32 U/L (ref 11–51)

## 2019-01-30 MED ORDER — LACTATED RINGERS IV BOLUS
1000.0000 mL | Freq: Once | INTRAVENOUS | Status: AC
  Administered 2019-01-30: 1000 mL via INTRAVENOUS

## 2019-01-30 MED ORDER — PROMETHAZINE HCL 25 MG PO TABS: 25.0000 mg | ORAL_TABLET | Freq: Four times a day (QID) | ORAL | 0 refills | Status: DC | PRN

## 2019-01-30 MED ORDER — ONDANSETRON 4 MG PO TBDP: 4.0000 mg | ORAL_TABLET | Freq: Three times a day (TID) | ORAL | 0 refills | Status: DC | PRN

## 2019-01-30 MED ORDER — POTASSIUM CHLORIDE CRYS ER 20 MEQ PO TBCR
40.0000 meq | EXTENDED_RELEASE_TABLET | Freq: Once | ORAL | Status: AC
  Administered 2019-01-30: 40 meq via ORAL
  Filled 2019-01-30: qty 2

## 2019-01-30 MED ORDER — POTASSIUM CHLORIDE 20 MEQ PO PACK: 20.0000 meq | PACK | Freq: Every day | ORAL | 0 refills | Status: DC

## 2019-01-30 MED ORDER — PROMETHAZINE HCL 25 MG/ML IJ SOLN
25.0000 mg | Freq: Once | INTRAMUSCULAR | Status: AC
  Administered 2019-01-30: 25 mg via INTRAVENOUS
  Filled 2019-01-30: qty 1

## 2019-01-30 MED ORDER — HALOPERIDOL LACTATE 5 MG/ML IJ SOLN
5.0000 mg | Freq: Once | INTRAMUSCULAR | Status: AC
  Administered 2019-01-30: 5 mg via INTRAVENOUS
  Filled 2019-01-30: qty 1

## 2019-01-30 NOTE — ED Provider Notes (Signed)
[facility]-EMERGENCY DEPT Provider Note   CSN: [id number] Arrival date & time: 01/30/19  [id number]     History   Chief Complaint Chief Complaint  Patient presents with  . Emesis    HPI Peter Suarez is a 28 y.o. male.  HPI  28 year old male presents with vomiting.  He states he is been having mild vomiting for about a week but then starting 2 days ago has been vomiting at least 10 times per day.  He states he thinks this is due to marijuana.  The patient states he has not had any diarrhea.  He has not had any marijuana until a day or 2 before this started for several months.  No fevers.  He has some upper abdominal discomfort and acid feeling.  He states some of his emesis was red but he is also been trying to eat watermelon to stay hydrated.  Past Medical History:  Diagnosis Date  . Anxiety   . Cannabinoid hyperemesis syndrome (HCC)   . GERD (gastroesophageal reflux disease)   . Nausea and vomiting    chronic, recurrent    Patient Active Problem List   Diagnosis Date Noted  . Protein-calorie malnutrition, severe (HCC) 01/12/2015  . Intractable nausea and vomiting 01/11/2015  . Erosive gastritis 01/11/2015  . GERD (gastroesophageal reflux disease) 01/11/2015  . Nausea & vomiting 01/11/2015  . Nausea with vomiting 10/30/2014  . Loss of weight 10/30/2014    Past Surgical History:  Procedure Laterality Date  . ESOPHAGOGASTRODUODENOSCOPY  10/31/2014        Home Medications    Prior to Admission medications   Medication Sig Start Date End Date Taking? Authorizing Provider  ondansetron (ZOFRAN ODT) 4 MG disintegrating tablet Take 1 tablet (4 mg total) by mouth every 8 (eight) hours as needed for nausea or vomiting. 01/30/19   Pricilla LovelessGoldston, Camber Ninh, MD  potassium chloride (KLOR-CON) 20 MEQ packet Take 20 mEq by mouth daily for 3 days. 01/30/19 02/02/19  Pricilla LovelessGoldston, Rendell Thivierge, MD  promethazine (PHENERGAN) 25 MG tablet Take 1 tablet (25 mg total) by mouth every 6 (six)  hours as needed for nausea or vomiting. 01/30/19   Pricilla LovelessGoldston, Euphemia Lingerfelt, MD    Family History Family History  Problem Relation Age of Onset  . High blood pressure Maternal Grandmother     Social History Social History   Tobacco Use  . Smoking status: Never Smoker  . Smokeless tobacco: Never Used  Substance Use Topics  . Alcohol use: No    Alcohol/week: 0.0 standard drinks    Comment: Rarely  . Drug use: Yes    Frequency: 7.0 times per week    Types: Marijuana     Allergies   Lorazepam   Review of Systems Review of Systems  Constitutional: Negative for fever.  Respiratory: Negative for shortness of breath.   Cardiovascular: Negative for chest pain.  Gastrointestinal: Positive for abdominal pain, nausea and vomiting. Negative for diarrhea.  All other systems reviewed and are negative.    Physical Exam Updated Vital Signs BP 118/80   Pulse 77   Temp 98.3 F (36.8 C) (Oral)   Resp 13   SpO2 98%   Physical Exam Vitals signs and nursing note reviewed.  Constitutional:      Appearance: He is well-developed.  HENT:     Head: Normocephalic and atraumatic.     Right Ear: External ear normal.     Left Ear: External ear normal.     Nose: Nose normal.  Mouth/Throat:     Mouth: Mucous membranes are dry.  Eyes:     General:        Right eye: No discharge.        Left eye: No discharge.  Neck:     Musculoskeletal: Neck supple.  Cardiovascular:     Rate and Rhythm: Normal rate and regular rhythm.     Heart sounds: Normal heart sounds.  Pulmonary:     Effort: Pulmonary effort is normal.     Breath sounds: Normal breath sounds.  Abdominal:     Palpations: Abdomen is soft.     Tenderness: There is abdominal tenderness (mild, generalized, soft).  Skin:    General: Skin is warm and dry.  Neurological:     Mental Status: He is alert.  Psychiatric:        Mood and Affect: Mood is not anxious.      ED Treatments / Results  Labs (all labs ordered are listed,  but only abnormal results are displayed) Labs Reviewed  COMPREHENSIVE METABOLIC PANEL - Abnormal; Notable for the following components:      Result Value   Potassium 3.4 (*)    Chloride 97 (*)    Glucose, Bld 189 (*)    BUN 32 (*)    Creatinine, Ser 1.47 (*)    Calcium 10.4 (*)    Total Protein 9.6 (*)    Albumin 5.8 (*)    Anion gap 17 (*)    All other components within normal limits  CBC WITH DIFFERENTIAL/PLATELET - Abnormal; Notable for the following components:   WBC 14.1 (*)    Hemoglobin 17.2 (*)    Neutro Abs 11.9 (*)    Lymphs Abs 0.6 (*)    Monocytes Absolute 1.4 (*)    Abs Immature Granulocytes 0.09 (*)    All other components within normal limits  LIPASE, BLOOD  URINALYSIS, ROUTINE W REFLEX MICROSCOPIC    EKG EKG Interpretation  Date/Time:  Monday January 30 2019 09:31:39 EST Ventricular Rate:  68 PR Interval:    QRS Duration: 95 QT Interval:  410 QTC Calculation: 436 R Axis:   72 Text Interpretation:  Sinus rhythm Borderline T wave abnormalities Baseline wander in lead(s) I III aVL V6 similar to 2015 Confirmed by Pricilla Loveless [phone]) on 01/30/2019 9:33:56 AM Also confirmed by Pricilla Loveless [phone]), editor Barbette Hair [phone])  on 01/30/2019 1:02:20 PM   Radiology Dg Abd Acute 2+v W 1v Chest  Result Date: 01/30/2019 CLINICAL DATA:  Vomiting.  Cannabis use. EXAM: DG ABDOMEN ACUTE W/ 1V CHEST COMPARISON:  November 14, 2017 FINDINGS: PA chest: No edema or consolidation. Heart size and pulmonary vascularity normal. No adenopathy. Supine and upright abdomen: There is moderate stool in the colon. There is no bowel dilatation or air-fluid level to suggest bowel obstruction. No free air. There is a phlebolith in the left pelvis. IMPRESSION: No lung edema or consolidation. No bowel obstruction or free air evident. Electronically Signed   By: Bretta Bang III M.D.   On: 01/30/2019 13:26    Procedures Procedures (including critical care time)  Medications  Ordered in ED Medications  lactated ringers bolus 1,000 mL (0 mLs Intravenous Stopped 01/30/19 1128)  lactated ringers bolus 1,000 mL (0 mLs Intravenous Stopped 01/30/19 1229)  haloperidol lactate (HALDOL) injection 5 mg (5 mg Intravenous Given 01/30/19 0945)  potassium chloride SA (K-DUR,KLOR-CON) CR tablet 40 mEq (40 mEq Oral Given 01/30/19 1128)  promethazine (PHENERGAN) injection 25 mg (25 mg Intravenous Given  01/30/19 1148)     Initial Impression / Assessment and Plan / ED Course  I have reviewed the triage vital signs and the nursing notes.  Pertinent labs & imaging results that were available during my care of the patient were reviewed by me and considered in my medical decision making (see chart for details).     Patient was treated with Haldol and started feeling much better.  With the mild hypokalemia, potassium was repleted but then several minutes later he vomited again.  Was then given Phenergan and is now tolerating oral fluids.  Is unclear how much of the potassium he retained so he will be discharged with potassium.  Abdominal tenderness is diffuse and mild and likely related to the vomiting.  This is happened many times before when using marijuana so I think it is unlikely he has an acute intra-abdominal emergency and do not think CT is needed.  Given he is tolerating p.o. and has been given IV fluids, I think he is stable for discharge home.  Mild bump in creatinine but given the stable vitals and now ability to tolerate p.o. he appears stable for close outpatient follow-up with PCP.  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

## 2019-01-30 NOTE — ED Notes (Signed)
Xray made aware. Pt is now ready for Xray

## 2019-01-30 NOTE — ED Triage Notes (Signed)
Pt verbalizes ongoing emesis from a week from cannabis use.

## 2019-01-30 NOTE — ED Notes (Addendum)
Pt in xray. Gingerale placed at bedside. Urinal placed at bedside for urine sample.

## 2019-01-30 NOTE — ED Notes (Signed)
Pt tolerating gingerale well. Pt denies being able to void at this time.

## 2019-02-03 ENCOUNTER — Encounter (HOSPITAL_COMMUNITY): Payer: Self-pay

## 2019-02-03 ENCOUNTER — Emergency Department (HOSPITAL_COMMUNITY)
Admission: EM | Admit: 2019-02-03 | Discharge: 2019-02-03 | Disposition: A | Discharge status: Home / Self Care | Payer: BLUE CROSS/BLUE SHIELD | Attending: Emergency Medicine | Admitting: Emergency Medicine

## 2019-02-03 ENCOUNTER — Other Ambulatory Visit: Payer: Self-pay

## 2019-02-03 DIAGNOSIS — R109 Unspecified abdominal pain: Secondary | ICD-10-CM | POA: Diagnosis not present

## 2019-02-03 DIAGNOSIS — E876 Hypokalemia: Secondary | ICD-10-CM | POA: Diagnosis not present

## 2019-02-03 DIAGNOSIS — F1290 Cannabis use, unspecified, uncomplicated: Secondary | ICD-10-CM

## 2019-02-03 DIAGNOSIS — R112 Nausea with vomiting, unspecified: Secondary | ICD-10-CM | POA: Diagnosis present

## 2019-02-03 DIAGNOSIS — K2930 Chronic superficial gastritis without bleeding: Secondary | ICD-10-CM

## 2019-02-03 LAB — CBC WITH DIFFERENTIAL/PLATELET
Abs Immature Granulocytes: 0.04 10*3/uL (ref 0.00–0.07)
Basophils Absolute: 0 10*3/uL (ref 0.0–0.1)
Basophils Relative: 0 %
Eosinophils Absolute: 0 10*3/uL (ref 0.0–0.5)
Eosinophils Relative: 0 %
HCT: 49.8 % (ref 39.0–52.0)
HEMOGLOBIN: 17.1 g/dL — AB (ref 13.0–17.0)
Immature Granulocytes: 0 %
Lymphocytes Relative: 11 %
Lymphs Abs: 1.1 10*3/uL (ref 0.7–4.0)
MCH: 30.9 pg (ref 26.0–34.0)
MCHC: 34.3 g/dL (ref 30.0–36.0)
MCV: 90.1 fL (ref 80.0–100.0)
MONOS PCT: 8 %
Monocytes Absolute: 0.8 10*3/uL (ref 0.1–1.0)
Neutro Abs: 7.9 10*3/uL — ABNORMAL HIGH (ref 1.7–7.7)
Neutrophils Relative %: 81 %
PLATELETS: 270 10*3/uL (ref 150–400)
RBC: 5.53 MIL/uL (ref 4.22–5.81)
RDW: 11.4 % — ABNORMAL LOW (ref 11.5–15.5)
WBC: 9.9 10*3/uL (ref 4.0–10.5)
nRBC: 0 % (ref 0.0–0.2)

## 2019-02-03 LAB — RAPID URINE DRUG SCREEN, HOSP PERFORMED
Amphetamines: NOT DETECTED
Barbiturates: NOT DETECTED
Benzodiazepines: NOT DETECTED
Cocaine: NOT DETECTED
Opiates: NOT DETECTED
Tetrahydrocannabinol: POSITIVE — AB

## 2019-02-03 LAB — URINALYSIS, ROUTINE W REFLEX MICROSCOPIC
Bilirubin Urine: NEGATIVE
Glucose, UA: NEGATIVE mg/dL
Hgb urine dipstick: NEGATIVE
KETONES UR: 5 mg/dL — AB
Leukocytes, UA: NEGATIVE
Nitrite: NEGATIVE
PROTEIN: 30 mg/dL — AB
Specific Gravity, Urine: 1.029 (ref 1.005–1.030)
pH: 6 (ref 5.0–8.0)

## 2019-02-03 LAB — COMPREHENSIVE METABOLIC PANEL
ALBUMIN: 5.4 g/dL — AB (ref 3.5–5.0)
ALT: 85 U/L — ABNORMAL HIGH (ref 0–44)
AST: 59 U/L — ABNORMAL HIGH (ref 15–41)
Alkaline Phosphatase: 69 U/L (ref 38–126)
Anion gap: 13 (ref 5–15)
BUN: 19 mg/dL (ref 6–20)
CHLORIDE: 95 mmol/L — AB (ref 98–111)
CO2: 29 mmol/L (ref 22–32)
Calcium: 9.7 mg/dL (ref 8.9–10.3)
Creatinine, Ser: 1.13 mg/dL (ref 0.61–1.24)
GFR calc Af Amer: >60 mL/min (ref >60)
GFR calc non Af Amer: >60 mL/min (ref >60)
GLUCOSE: 175 mg/dL — AB (ref 70–99)
Potassium: 2.8 mmol/L — ABNORMAL LOW (ref 3.5–5.1)
Sodium: 137 mmol/L (ref 135–145)
Total Bilirubin: 1.2 mg/dL (ref 0.3–1.2)
Total Protein: 8.7 g/dL — ABNORMAL HIGH (ref 6.5–8.1)

## 2019-02-03 LAB — LIPASE, BLOOD: Lipase: 44 U/L (ref 11–51)

## 2019-02-03 MED ORDER — FAMOTIDINE IN NACL 20-0.9 MG/50ML-% IV SOLN
20.0000 mg | Freq: Once | INTRAVENOUS | Status: AC
  Administered 2019-02-03: 20 mg via INTRAVENOUS
  Filled 2019-02-03: qty 50

## 2019-02-03 MED ORDER — MAGNESIUM SULFATE 2 GM/50ML IV SOLN
2.0000 g | Freq: Once | INTRAVENOUS | Status: AC
  Administered 2019-02-03: 2 g via INTRAVENOUS
  Filled 2019-02-03: qty 50

## 2019-02-03 MED ORDER — SODIUM CHLORIDE 0.9 % IV BOLUS
1000.0000 mL | Freq: Once | INTRAVENOUS | Status: AC
  Administered 2019-02-03: 1000 mL via INTRAVENOUS

## 2019-02-03 MED ORDER — POTASSIUM CHLORIDE 10 MEQ/100ML IV SOLN
10.0000 meq | Freq: Once | INTRAVENOUS | Status: AC
  Administered 2019-02-03: 10 meq via INTRAVENOUS
  Filled 2019-02-03: qty 100

## 2019-02-03 MED ORDER — HALOPERIDOL LACTATE 5 MG/ML IJ SOLN
5.0000 mg | Freq: Once | INTRAMUSCULAR | Status: AC
  Administered 2019-02-03: 5 mg via INTRAVENOUS
  Filled 2019-02-03: qty 1

## 2019-02-03 MED ORDER — PROMETHAZINE HCL 25 MG/ML IJ SOLN
25.0000 mg | Freq: Once | INTRAMUSCULAR | Status: AC
  Administered 2019-02-03: 25 mg via INTRAVENOUS
  Filled 2019-02-03: qty 1

## 2019-02-03 NOTE — ED Notes (Signed)
He tells me he is feeling "much better." He is drinking ginger ale without issue.

## 2019-02-03 NOTE — ED Provider Notes (Signed)
[facility]-EMERGENCY DEPT Provider Note   CSN: [id number] Arrival date & time: 02/03/19  [id number]     History   Chief Complaint Chief Complaint  Patient presents with  . Nausea    HPI Peter Suarez is a 28 y.o. male with history of marijuana use, cyclical nausea and vomiting, is here for evaluation of nausea and vomiting.  Onset 2 weeks ago.  States this is typical of his cannabinoid hyperemesis syndrome that he has had for at least 2 to 3 years.  Patient states that every time he uses marijuana his symptoms return.  The last few times he is used marijuana in July and end of January has caused return of nausea, vomiting and diffuse abdominal pain.  His pain is moderate, diffuse, cramping.  No alleviating or aggravating factors.  He was seen in the ER on 2/3 for similar symptoms and was discharged with Zofran and Phenergan but states he has not been able to keep these down.  Last emesis was here upon arrival to the ER.  He denies fevers, chills, hematemesis, changes to bowel movements, urinary symptoms, chest pain, shortness of breath.  He has been seen by gastroenterology Dr. Leone PayorGessner.  Remembers having an EGD in the past.  Denies frequent use of NSAIDs or EtOH.  No history of abdominal surgeries. HPI  Past Medical History:  Diagnosis Date  . Anxiety   . Cannabinoid hyperemesis syndrome (HCC)   . GERD (gastroesophageal reflux disease)   . Nausea and vomiting    chronic, recurrent    Patient Active Problem List   Diagnosis Date Noted  . Protein-calorie malnutrition, severe (HCC) 01/12/2015  . Intractable nausea and vomiting 01/11/2015  . Erosive gastritis 01/11/2015  . GERD (gastroesophageal reflux disease) 01/11/2015  . Nausea & vomiting 01/11/2015  . Nausea with vomiting 10/30/2014  . Loss of weight 10/30/2014    Past Surgical History:  Procedure Laterality Date  . ESOPHAGOGASTRODUODENOSCOPY  10/31/2014        Home Medications    Prior to  Admission medications   Medication Sig Start Date End Date Taking? Authorizing Provider  ondansetron (ZOFRAN ODT) 4 MG disintegrating tablet Take 1 tablet (4 mg total) by mouth every 8 (eight) hours as needed for nausea or vomiting. 01/30/19  Yes Pricilla LovelessGoldston, Scott, MD  promethazine (PHENERGAN) 25 MG tablet Take 1 tablet (25 mg total) by mouth every 6 (six) hours as needed for nausea or vomiting. 01/30/19  Yes Pricilla LovelessGoldston, Scott, MD  potassium chloride (KLOR-CON) 20 MEQ packet Take 20 mEq by mouth daily for 3 days. Patient not taking: Reported on 02/03/2019 01/30/19 02/02/19  Pricilla LovelessGoldston, Scott, MD    Family History Family History  Problem Relation Age of Onset  . High blood pressure Maternal Grandmother     Social History Social History   Tobacco Use  . Smoking status: Never Smoker  . Smokeless tobacco: Never Used  Substance Use Topics  . Alcohol use: No    Alcohol/week: 0.0 standard drinks    Comment: Rarely  . Drug use: Yes    Frequency: 7.0 times per week    Types: Marijuana     Allergies   Lorazepam   Review of Systems Review of Systems  Gastrointestinal: Positive for abdominal pain, nausea and vomiting.  All other systems reviewed and are negative.    Physical Exam Updated Vital Signs BP 103/75   Pulse (!) 53   Temp 98 F (36.7 C) (Oral)   Resp 13   Ht  6\' 2"  (1.88 m)   Wt 90.7 kg   SpO2 97%   BMI 25.68 kg/m   Physical Exam Vitals signs and nursing note reviewed.  Constitutional:      Appearance: He is well-developed.     Comments: Non toxic.  HENT:     Head: Normocephalic and atraumatic.     Nose: Nose normal.     Mouth/Throat:     Comments: Dry lips but moist mucous membranes. Eyes:     Conjunctiva/sclera: Conjunctivae normal.     Pupils: Pupils are equal, round, and reactive to light.  Neck:     Musculoskeletal: Normal range of motion.  Cardiovascular:     Rate and Rhythm: Normal rate and regular rhythm.     Heart sounds: Normal heart sounds.  Pulmonary:       Effort: Pulmonary effort is normal.     Breath sounds: Normal breath sounds.  Abdominal:     General: Bowel sounds are normal.     Palpations: Abdomen is soft.     Tenderness: There is abdominal tenderness.     Comments: Diffuse abdominal tenderness.  Negative Murphy's and McBurney's. No G/R/R. No suprapubic or CVA tenderness. Negative Murphy's and McBurney's.  Active bowel sounds to lower quadrants.  Musculoskeletal: Normal range of motion.  Skin:    General: Skin is warm and dry.     Capillary Refill: Capillary refill takes less than 2 seconds.  Neurological:     Mental Status: He is alert and oriented to person, place, and time.  Psychiatric:        Behavior: Behavior normal.        Thought Content: Thought content normal.        Judgment: Judgment normal.      ED Treatments / Results  Labs (all labs ordered are listed, but only abnormal results are displayed) Labs Reviewed  CBC WITH DIFFERENTIAL/PLATELET - Abnormal; Notable for the following components:      Result Value   Hemoglobin 17.1 (*)    RDW 11.4 (*)    Neutro Abs 7.9 (*)    All other components within normal limits  COMPREHENSIVE METABOLIC PANEL - Abnormal; Notable for the following components:   Potassium 2.8 (*)    Chloride 95 (*)    Glucose, Bld 175 (*)    Total Protein 8.7 (*)    Albumin 5.4 (*)    AST 59 (*)    ALT 85 (*)    All other components within normal limits  URINALYSIS, ROUTINE W REFLEX MICROSCOPIC - Abnormal; Notable for the following components:   Color, Urine AMBER (*)    Ketones, ur 5 (*)    Protein, ur 30 (*)    Bacteria, UA RARE (*)    All other components within normal limits  RAPID URINE DRUG SCREEN, HOSP PERFORMED - Abnormal; Notable for the following components:   Tetrahydrocannabinol POSITIVE (*)    All other components within normal limits  LIPASE, BLOOD    EKG None  Radiology No results found.  Procedures Procedures (including critical care time)  Medications  Ordered in ED Medications  haloperidol lactate (HALDOL) injection 5 mg (5 mg Intravenous Given 02/03/19 0830)  sodium chloride 0.9 % bolus 1,000 mL (0 mLs Intravenous Stopped 02/03/19 1107)  famotidine (PEPCID) IVPB 20 mg premix (0 mg Intravenous Stopped 02/03/19 0921)  promethazine (PHENERGAN) injection 25 mg (25 mg Intravenous Given 02/03/19 0830)  potassium chloride 10 mEq in 100 mL IVPB (0 mEq Intravenous Stopped 02/03/19 1030)  magnesium sulfate IVPB 2 g 50 mL (0 g Intravenous Stopped 02/03/19 1020)     Initial Impression / Assessment and Plan / ED Course  I have reviewed the triage vital signs and the nursing notes.  Pertinent labs & imaging results that were available during my care of the patient were reviewed by me and considered in my medical decision making (see chart for details).  Clinical Course as of Feb 03 1210  Fri Feb 03, 2019  0851 Tetrahydrocannabinol(!): POSITIVE [CG]  [phone] Ketones, ur(!): 5 [CG]  [phone] Potassium(!): 2.8 [CG]    Clinical Course User Index [CG] Liberty Handy, PA-C   Symptoms and history do suggest likely acute on chronic CHS.  He has diffuse tenderness but negative murphy's and mcburney's. No peritonitis.  Given chronicity of symptoms with recent trigger, I have lower suspicion for acute cholecystitis, appendicitis, pancreatitis or other acute intraabdominal pathology.  He has no CP, SOB, HD instability and doubt esophageal tear or perforated viscus, SBO.  We will obtain screening labs, tx symptoms, reassess, EKG for recurrent use of QTc prolonging meds.   6644: Minimally elevated LFTs. +THC. Mild ketonuria. K 2.8. EKG with prolonged QTc and TWI, could be related to K, but will re-check EKG to confirm.    0945: Repeat EKG with normal QTc. Re-evaluated pt who feels better. No emesis, improved abd exam. Will plan for IVF, meds, fluid challenge.  Patient has had abdominal ultrasound and EGD for symptoms in the past that showed esophagitis and gastritis, will  encourage PPI as well for this.  Anticipate dc with f/u with GI/PCP who have prescribed him oral haldol.     1210: Pt tolerating fluids. He has K and phenergan rx. Return precautions given.  Final Clinical Impressions(s) / ED Diagnoses   Final diagnoses:  Chronic superficial gastritis without bleeding  Nausea and vomiting in adult patient  Marijuana use, episodic  Hypokalemia    ED Discharge Orders    None       Jerrell Mylar 02/03/19 1211    Mesner, Barbara Cower, MD 02/03/19 1419

## 2019-02-03 NOTE — ED Triage Notes (Signed)
Pt states he has been nauseated for 2 weeks. Pt states he was seen on Monday for the same without relief. Pt states he could not keep zofran and phenergan down.

## 2019-02-03 NOTE — Discharge Instructions (Addendum)
You were seen in the ER for nausea, vomiting, abdominal pain in setting of recent marijuana use. You felt better after medicines are were able to tolerate fluids by mouth.    K was 2.8, this was repleted with potassium and magnesium.  Repeat EKG was normal.   You need to stop smoking marijuana.  This is the only thing that will prevent recurrence of symptoms.   Last upper endoscopy showed gastritis and esophagitis, so start taking omeprazole 40 mg on an empty stomach every morning, wait 20-30 min before eating.  Following up with gastroenterology is crucial, they  may be able to give you different long term medications to help with your symptoms.    You can use phenergan rectally as needed for nausea.  Re-start potassium supplements.   Return for fever, worsening or new symptoms, blood in vomit or stool, chest pain, shortness of breath, palpitations, light-headedness.

## 2019-02-03 NOTE — ED Notes (Signed)
Provider at bedside

## 2019-06-25 ENCOUNTER — Emergency Department
Admission: EM | Admit: 2019-06-25 | Discharge: 2019-06-25 | Disposition: A | Discharge status: Home / Self Care | Payer: BC Managed Care – PPO | Attending: Emergency Medicine | Admitting: Emergency Medicine

## 2019-06-25 ENCOUNTER — Other Ambulatory Visit: Payer: Self-pay

## 2019-06-25 ENCOUNTER — Emergency Department: Payer: BC Managed Care – PPO

## 2019-06-25 DIAGNOSIS — F12988 Cannabis use, unspecified with other cannabis-induced disorder: Secondary | ICD-10-CM | POA: Insufficient documentation

## 2019-06-25 DIAGNOSIS — R112 Nausea with vomiting, unspecified: Secondary | ICD-10-CM | POA: Insufficient documentation

## 2019-06-25 DIAGNOSIS — F1290 Cannabis use, unspecified, uncomplicated: Secondary | ICD-10-CM

## 2019-06-25 LAB — COMPREHENSIVE METABOLIC PANEL
ALT: 22 U/L (ref 0–44)
AST: 18 U/L (ref 15–41)
Albumin: 5.5 g/dL — ABNORMAL HIGH (ref 3.5–5.0)
Alkaline Phosphatase: 87 U/L (ref 38–126)
Anion gap: 17 — ABNORMAL HIGH (ref 5–15)
BUN: 28 mg/dL — ABNORMAL HIGH (ref 6–20)
CO2: 26 mmol/L (ref 22–32)
Calcium: 10.3 mg/dL (ref 8.9–10.3)
Chloride: 96 mmol/L — ABNORMAL LOW (ref 98–111)
Creatinine, Ser: 1.26 mg/dL — ABNORMAL HIGH (ref 0.61–1.24)
GFR calc Af Amer: >60 mL/min (ref >60)
GFR calc non Af Amer: >60 mL/min (ref >60)
Glucose, Bld: 209 mg/dL — ABNORMAL HIGH (ref 70–99)
Potassium: 3.2 mmol/L — ABNORMAL LOW (ref 3.5–5.1)
Sodium: 139 mmol/L (ref 135–145)
Total Bilirubin: 1.2 mg/dL (ref 0.3–1.2)
Total Protein: 9.4 g/dL — ABNORMAL HIGH (ref 6.5–8.1)

## 2019-06-25 LAB — CBC
HCT: 48.1 % (ref 39.0–52.0)
Hemoglobin: 17 g/dL (ref 13.0–17.0)
MCH: 30.5 pg (ref 26.0–34.0)
MCHC: 35.3 g/dL (ref 30.0–36.0)
MCV: 86.2 fL (ref 80.0–100.0)
Platelets: 312 10*3/uL (ref 150–400)
RBC: 5.58 MIL/uL (ref 4.22–5.81)
RDW: 11.9 % (ref 11.5–15.5)
WBC: 16.8 10*3/uL — ABNORMAL HIGH (ref 4.0–10.5)
nRBC: 0 % (ref 0.0–0.2)

## 2019-06-25 LAB — LIPASE, BLOOD: Lipase: 27 U/L (ref 11–51)

## 2019-06-25 MED ORDER — SODIUM CHLORIDE 0.9% FLUSH
3.0000 mL | Freq: Once | INTRAVENOUS | Status: AC
  Administered 2019-06-25: 3 mL via INTRAVENOUS

## 2019-06-25 MED ORDER — HALOPERIDOL LACTATE 5 MG/ML IJ SOLN
5.0000 mg | Freq: Once | INTRAMUSCULAR | Status: AC
  Administered 2019-06-25: 5 mg via INTRAMUSCULAR
  Filled 2019-06-25: qty 1

## 2019-06-25 MED ORDER — POTASSIUM CHLORIDE ER 10 MEQ PO TBCR: 10.0000 meq | EXTENDED_RELEASE_TABLET | Freq: Every day | ORAL | 0 refills | Status: AC

## 2019-06-25 MED ORDER — ONDANSETRON 4 MG PO TBDP: 4.0000 mg | ORAL_TABLET | Freq: Four times a day (QID) | ORAL | 0 refills | Status: DC | PRN

## 2019-06-25 MED ORDER — SODIUM CHLORIDE 0.9 % IV BOLUS
1000.0000 mL | Freq: Once | INTRAVENOUS | Status: AC
  Administered 2019-06-25: 1000 mL via INTRAVENOUS

## 2019-06-25 MED ORDER — PROMETHAZINE HCL 25 MG/ML IJ SOLN
25.0000 mg | Freq: Once | INTRAMUSCULAR | Status: AC
  Administered 2019-06-25: 25 mg via INTRAVENOUS
  Filled 2019-06-25: qty 1

## 2019-06-25 MED ORDER — POTASSIUM CHLORIDE 10 MEQ/100ML IV SOLN
10.0000 meq | Freq: Once | INTRAVENOUS | Status: AC
  Administered 2019-06-25: 10 meq via INTRAVENOUS
  Filled 2019-06-25: qty 100

## 2019-06-25 MED ORDER — ONDANSETRON HCL 4 MG/2ML IJ SOLN: 4.0000 mg | Freq: Once | INTRAMUSCULAR | Status: DC | PRN

## 2019-06-25 NOTE — ED Notes (Signed)
Pt c/o abd cramping with N/V for the past 5 days, states he has a hx of hyperemesis with cannabis use in the past and recent lost his grandmother and smoked again, denies diarrhea.

## 2019-06-25 NOTE — Discharge Instructions (Addendum)
No driving today.  If you're unable to see her primary care doctor you may return to the emergency room or go to the [facility] walk-in [facility] in 1 or 2 days for reexam.  Please return to the emergency room right away if you are to develop a fever, severe nausea, your pain becomes severe or worsens, you are unable to keep food down, begin vomiting any dark or bloody fluid, you develop any dark or bloody stools, feel dehydrated, or other new concerns or symptoms arise.

## 2019-06-25 NOTE — ED Triage Notes (Signed)
Pt reports that he was dropped off this am, reports that he has been vomiting for the past 4-5 days, states that he believes it is cannaboid hyperemesis. States that it has been longer than 5 days since he had marijuana

## 2019-06-25 NOTE — ED Provider Notes (Signed)
[facility] Emergency Department Provider Note   ____________________________________________   First MD Initiated Contact with Patient 06/25/19 [phone]     (approximate)  I have reviewed the triage vital signs and the nursing notes.   HISTORY  Chief Complaint Emesis    HPI Peter Suarez is a 28 y.o. male here for evaluation of cannabinoid hyperemesis  Patient reports that he has "cannabinoid hyperemesis" and he started having vomiting about 4 to 5 days ago.  He been smoking marijuana frequently for the last several days before that because of the loss of his grandmother.  Denies wine harm himself or anyone else but reports he was using marijuana to treat his sadness about the loss of his grandma to  He reports this happened many times the past.  Start having abdominal pain nausea and vomiting threw up several times for several days.  He has a note with him that reports that he has had good improvement in the past when given "Haldol" and also reports "finger".  Reports same symptoms multiple times  No fevers or chills.  No exposure to anyone known to have coronavirus.  No cough no congestion.  No chest pain no shortness of breath.  Denies severe abdominal pain, just reports the whole thing will do start to feel very sore but there is no severe pain in any one area   Past Medical History:  Diagnosis Date  . Anxiety   . Cannabinoid hyperemesis syndrome (HCC)   . GERD (gastroesophageal reflux disease)   . Nausea and vomiting    chronic, recurrent    Patient Active Problem List   Diagnosis Date Noted  . Protein-calorie malnutrition, severe (HCC) 01/12/2015  . Intractable nausea and vomiting 01/11/2015  . Erosive gastritis 01/11/2015  . GERD (gastroesophageal reflux disease) 01/11/2015  . Nausea & vomiting 01/11/2015  . Nausea with vomiting 10/30/2014  . Loss of weight 10/30/2014    Past Surgical History:  Procedure Laterality Date  .  ESOPHAGOGASTRODUODENOSCOPY  10/31/2014    Prior to Admission medications   Medication Sig Start Date End Date Taking? Authorizing Provider  ondansetron (ZOFRAN ODT) 4 MG disintegrating tablet Take 1 tablet (4 mg total) by mouth every 6 (six) hours as needed for nausea or vomiting. 06/25/19   Sharyn CreamerQuale, [place], MD  potassium chloride (K-DUR) 10 MEQ tablet Take 1 tablet (10 mEq total) by mouth daily. 06/25/19   Sharyn CreamerQuale, Amillion, MD  promethazine (PHENERGAN) 25 MG tablet Take 1 tablet (25 mg total) by mouth every 6 (six) hours as needed for nausea or vomiting. 01/30/19   Pricilla LovelessGoldston, Scott, MD    Allergies Lorazepam  Family History  Problem Relation Age of Onset  . High blood pressure Maternal Grandmother     Social History Social History   Tobacco Use  . Smoking status: Never Smoker  . Smokeless tobacco: Never Used  Substance Use Topics  . Alcohol use: No    Alcohol/week: 0.0 standard drinks    Comment: Rarely  . Drug use: Yes    Frequency: 7.0 times per week    Types: Marijuana    Review of Systems Constitutional: No fever/chills Eyes: No visual changes. ENT: No sore throat.  Capsule scratchy from the throwing up. Cardiovascular: Denies chest pain. Respiratory: Denies shortness of breath. Gastrointestinal: The HPI Genitourinary: Negative for dysuria. Musculoskeletal: Negative for back pain. Skin: Negative for rash. Neurological: Negative for headaches, areas of focal weakness or numbness.    ____________________________________________   PHYSICAL EXAM:  VITAL  SIGNS: ED Triage Vitals  Enc Vitals Group     BP 06/25/19 0754 (!) 148/86     Pulse Rate 06/25/19 0754 78     Resp 06/25/19 0754 18     Temp 06/25/19 0754 98.5 F (36.9 C)     Temp Source 06/25/19 0754 Oral     SpO2 06/25/19 0754 98 %     Weight 06/25/19 0755 210 lb (95.3 kg)     Height 06/25/19 0755 6\' 1"  (1.854 m)     Head Circumference --      Peak Flow --      Pain Score 06/25/19 0755 7     Pain Loc --       Pain Edu? --      Excl. in [place]? --     Constitutional: Alert and oriented. Well appearing and in no acute distress.  He is pleasant.  He is holding emesis bag, no vomiting. Eyes: Conjunctivae are normal. Head: Atraumatic. Nose: No congestion/rhinnorhea. Mouth/Throat: Mucous membranes are moist. Neck: No stridor.  Cardiovascular: Normal rate, regular rhythm. Grossly normal heart sounds.  Good peripheral circulation. Respiratory: Normal respiratory effort.  No retractions. Lungs CTAB. Gastrointestinal: Soft and mild tenderness throughout but no focal tenderness.  There is no focal pain McBurney's point.  Negative Murphy.  No rebound guarding or peritonitis any quadrant. Musculoskeletal: No lower extremity tenderness nor edema. Neurologic:  Normal speech and language. No gross focal neurologic deficits are appreciated.  Skin:  Skin is warm, dry and intact. No rash noted. Psychiatric: Mood and affect are normal. Speech and behavior are normal.  ____________________________________________   LABS (all labs ordered are listed, but only abnormal results are displayed)  Labs Reviewed  COMPREHENSIVE METABOLIC PANEL - Abnormal; Notable for the following components:      Result Value   Potassium 3.2 (*)    Chloride 96 (*)    Glucose, Bld 209 (*)    BUN 28 (*)    Creatinine, Ser 1.26 (*)    Total Protein 9.4 (*)    Albumin 5.5 (*)    Anion gap 17 (*)    All other components within normal limits  CBC - Abnormal; Notable for the following components:   WBC 16.8 (*)    All other components within normal limits  LIPASE, BLOOD  URINALYSIS, COMPLETE (UACMP) WITH MICROSCOPIC   ____________________________________________  EKG   ____________________________________________  RADIOLOGY  Dg Abd 2 Views  Result Date: 06/25/2019 CLINICAL DATA:  Abdominal cramping with nausea and vomiting for the past 5 days. EXAM: ABDOMEN - 2 VIEW COMPARISON:  Plain film of the abdomen dated 01/30/2019.  FINDINGS: The bowel gas pattern is normal. There is no evidence of free air. No radio-opaque calculi or other significant radiographic abnormality is seen. Lung bases appear clear. IMPRESSION: Negative. Electronically Signed   By: Franki Cabot M.D.   On: 06/25/2019 09:42    [place] imaging negative for acute ____________________________________________   PROCEDURES  Procedure(s) performed: None  Procedures  Critical Care performed: No  ____________________________________________   INITIAL IMPRESSION / ASSESSMENT AND PLAN / ED COURSE  Pertinent labs & imaging results that were available during my care of the patient were reviewed by me and considered in my medical decision making (see chart for details).   Differential diagnosis includes but is not limited to, abdominal perforation, aortic dissection, cholecystitis, appendicitis, diverticulitis, colitis, esophagitis/gastritis, kidney stone, pyelonephritis, urinary tract infection, aortic aneurysm. All are considered in decision and treatment plan. 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  I will treat him with Haldol and Phenergan and IV hydration as he requests and reports good improvement with the past.  I counseled him on marijuana use and the risks such as hyperemesis, he reports he is well aware.  Reassuring abdominal exam without evidence of acute focal abnormality to suggest a need for CT imaging.  Appears most consistent with hyperemesis based on clinical history and exam.  Plan to reassess after hydration, antiemetics.  Potassium low, also slightly elevated anion gap and slightly elevated glucose, and review of previous evaluations this seems to be similar in pattern to previous evaluations for similar symptoms.  Will replete potassium.  Plan to hydrate.     ----------------------------------------- 12:42 PM on  06/25/2019 -----------------------------------------  Patient feels much improved.  Tolerating by mouth well.  No ongoing pain or discomfort.  Comfortable with plan for discharge with prescription for Zofran  Mother coming to pick him up.  Appears well in no distress.  No pain or ongoing symptoms.  ____________________________________________   FINAL CLINICAL IMPRESSION(S) / ED DIAGNOSES  Final diagnoses:  Non-intractable vomiting with nausea, unspecified vomiting type  Cannabinoid hyperemesis syndrome (HCC)        Note:  This document was prepared using Dragon voice recognition software and may include unintentional dictation errors       Sharyn CreamerQuale, [place], MD 06/25/19 1243

## 2019-08-31 ENCOUNTER — Other Ambulatory Visit: Payer: Self-pay

## 2019-10-04 IMAGING — CR ABDOMEN - 2 VIEW
3 series · 3 of 3 positions shown · non-contrast
Comparison: Plain film of the abdomen dated 01/30/2019.

CLINICAL DATA: Abdominal cramping with nausea and vomiting for the
past 5 days.

EXAM:
ABDOMEN - 2 VIEW

[abdomen erect]
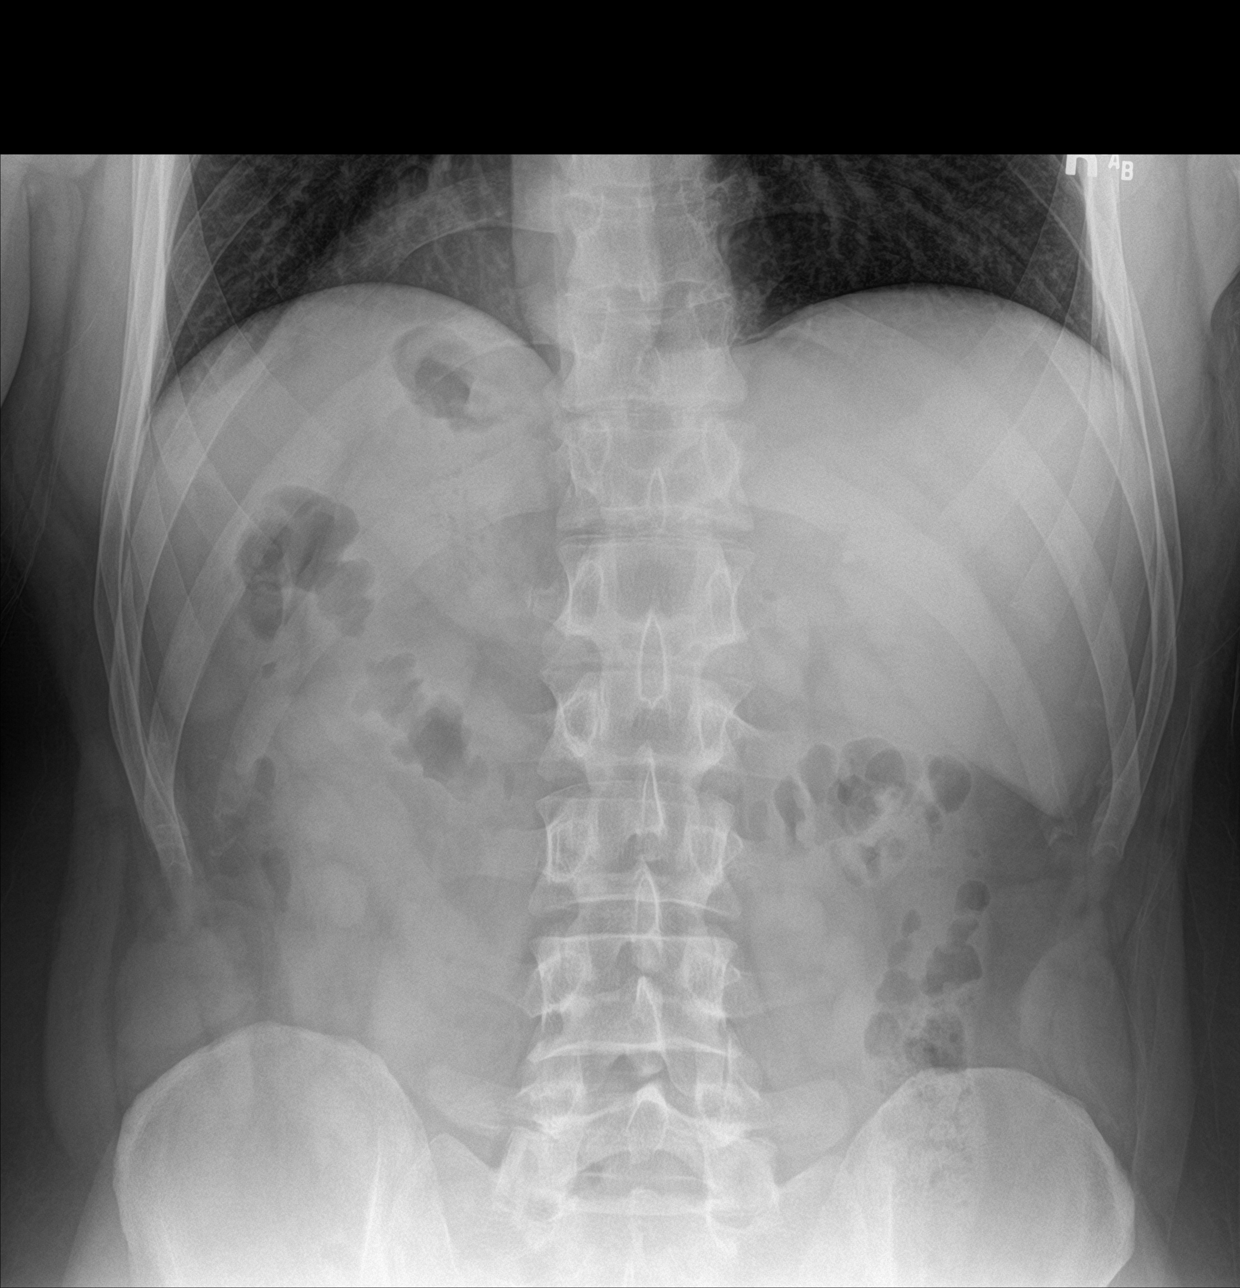

[abdomen supine (1 of 2)]
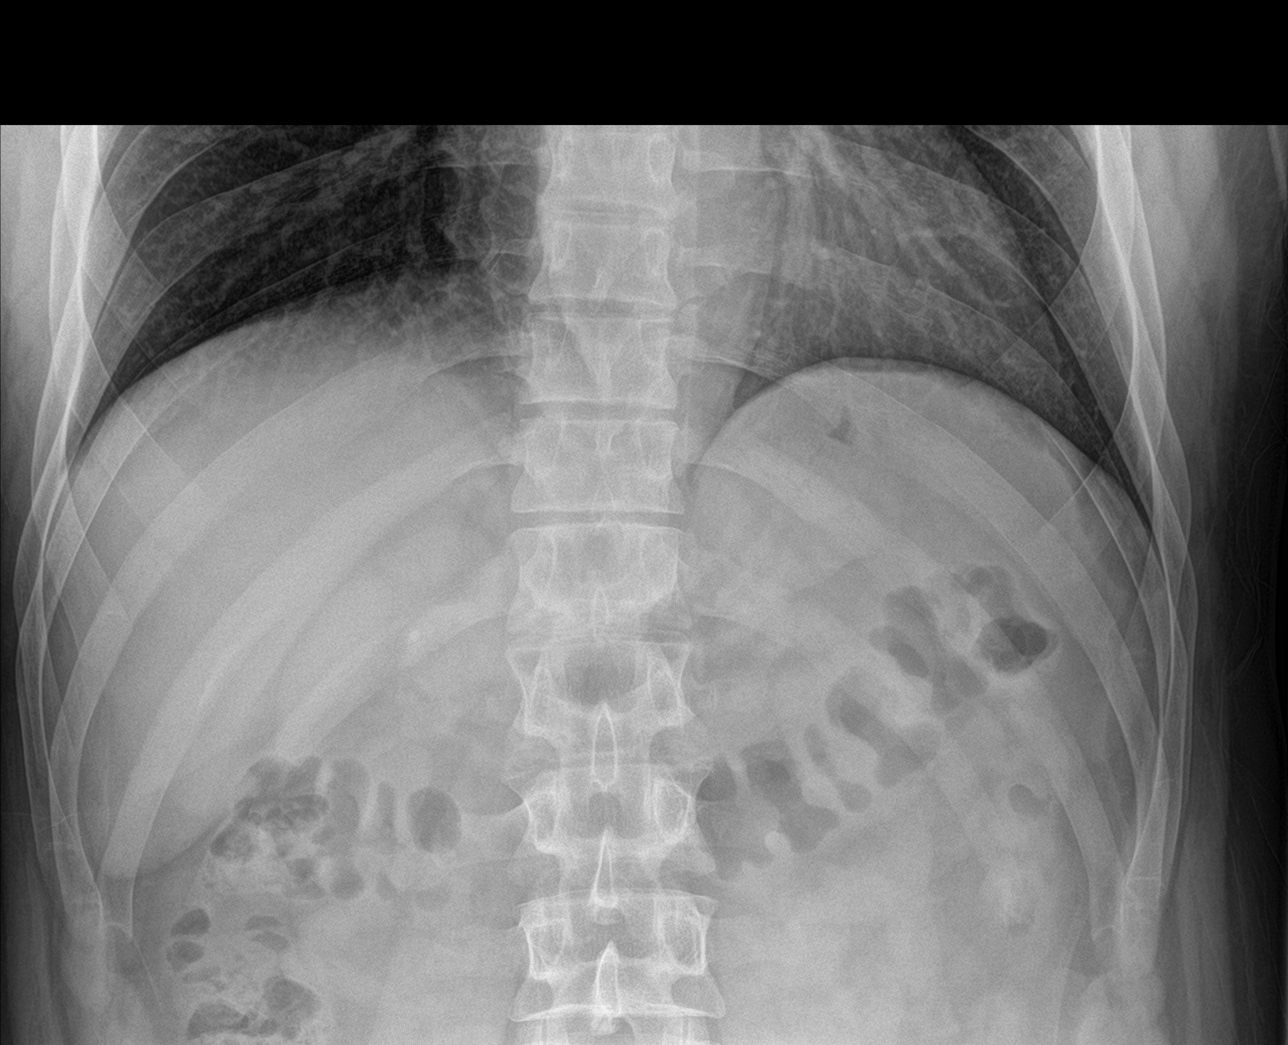

[abdomen supine (2 of 2)]
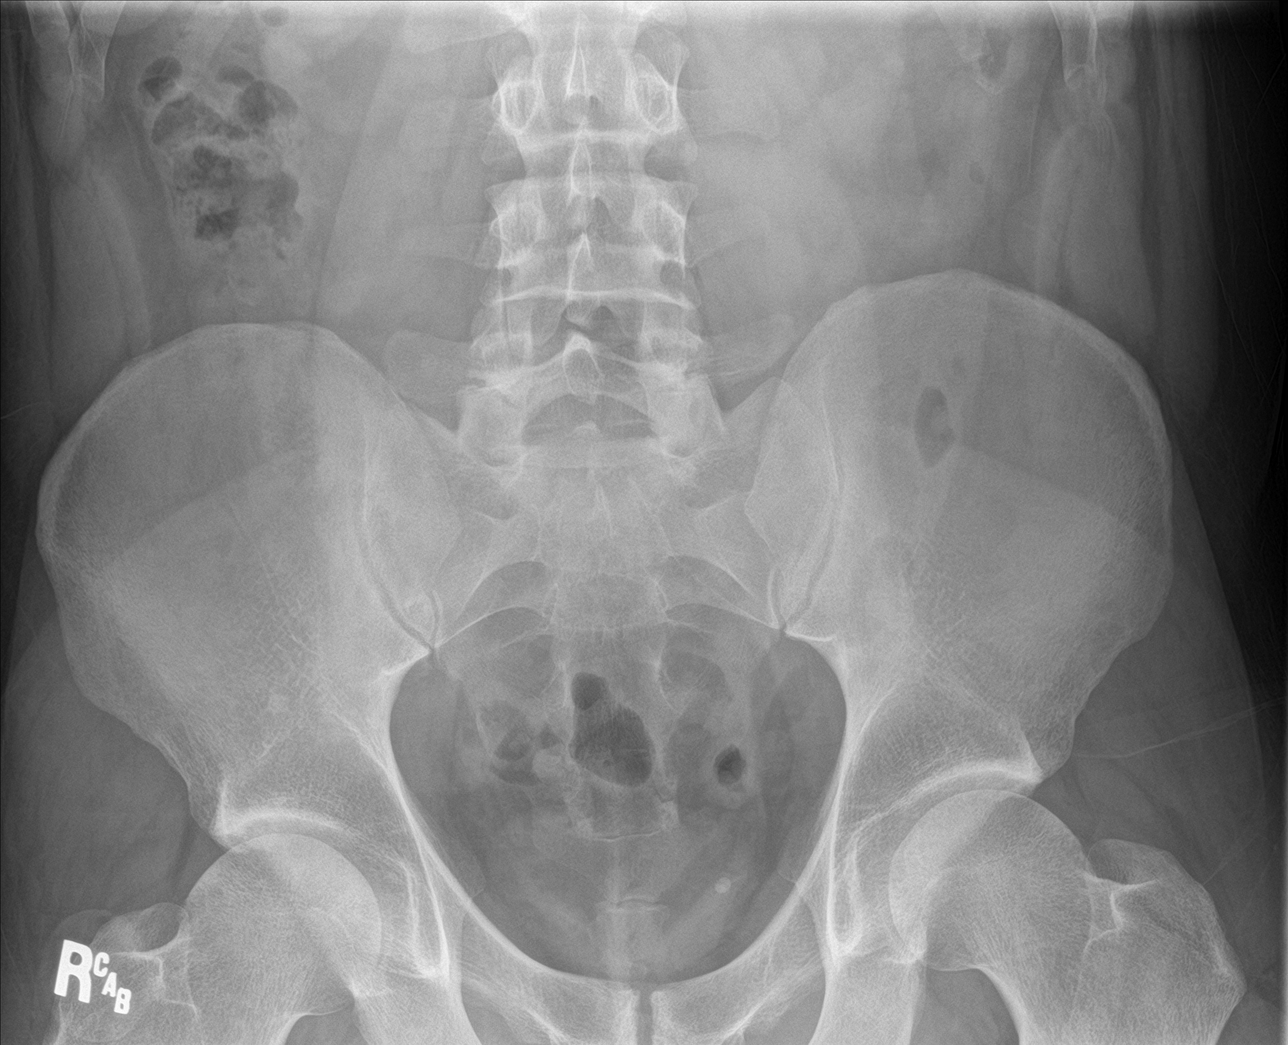

[3 of 3 positions shown; findings below may reference images not displayed]

FINDINGS: The bowel gas pattern is normal. There is no evidence of free air.
No radio-opaque calculi or other significant radiographic
abnormality is seen. Lung bases appear clear.
IMPRESSION: Negative.

## 2019-11-20 ENCOUNTER — Other Ambulatory Visit: Payer: Self-pay

## 2019-11-20 ENCOUNTER — Emergency Department
Admission: EM | Admit: 2019-11-20 | Discharge: 2019-11-20 | Disposition: A | Discharge status: Home / Self Care | Payer: BC Managed Care – PPO | Attending: Emergency Medicine | Admitting: Emergency Medicine

## 2019-11-20 ENCOUNTER — Encounter: Payer: Self-pay | Admitting: Emergency Medicine

## 2019-11-20 DIAGNOSIS — E860 Dehydration: Secondary | ICD-10-CM | POA: Diagnosis not present

## 2019-11-20 DIAGNOSIS — R112 Nausea with vomiting, unspecified: Secondary | ICD-10-CM | POA: Insufficient documentation

## 2019-11-20 DIAGNOSIS — R1013 Epigastric pain: Secondary | ICD-10-CM | POA: Insufficient documentation

## 2019-11-20 DIAGNOSIS — Z79899 Other long term (current) drug therapy: Secondary | ICD-10-CM | POA: Insufficient documentation

## 2019-11-20 DIAGNOSIS — F1290 Cannabis use, unspecified, uncomplicated: Secondary | ICD-10-CM | POA: Insufficient documentation

## 2019-11-20 LAB — CBC WITH DIFFERENTIAL/PLATELET
Abs Immature Granulocytes: 0.05 10*3/uL (ref 0.00–0.07)
Basophils Absolute: 0 10*3/uL (ref 0.0–0.1)
Basophils Relative: 0 %
Eosinophils Absolute: 0 10*3/uL (ref 0.0–0.5)
Eosinophils Relative: 0 %
HCT: 50.5 % (ref 39.0–52.0)
Hemoglobin: 17.9 g/dL — ABNORMAL HIGH (ref 13.0–17.0)
Immature Granulocytes: 1 %
Lymphocytes Relative: 15 %
Lymphs Abs: 1.6 10*3/uL (ref 0.7–4.0)
MCH: 30.4 pg (ref 26.0–34.0)
MCHC: 35.4 g/dL (ref 30.0–36.0)
MCV: 85.9 fL (ref 80.0–100.0)
Monocytes Absolute: 1.1 10*3/uL — ABNORMAL HIGH (ref 0.1–1.0)
Monocytes Relative: 11 %
Neutro Abs: 7.8 10*3/uL — ABNORMAL HIGH (ref 1.7–7.7)
Neutrophils Relative %: 73 %
Platelets: 308 10*3/uL (ref 150–400)
RBC: 5.88 MIL/uL — ABNORMAL HIGH (ref 4.22–5.81)
RDW: 11.8 % (ref 11.5–15.5)
WBC: 10.6 10*3/uL — ABNORMAL HIGH (ref 4.0–10.5)
nRBC: 0 % (ref 0.0–0.2)

## 2019-11-20 LAB — COMPREHENSIVE METABOLIC PANEL
ALT: 56 U/L — ABNORMAL HIGH (ref 0–44)
AST: 38 U/L (ref 15–41)
Albumin: 5.4 g/dL — ABNORMAL HIGH (ref 3.5–5.0)
Alkaline Phosphatase: 91 U/L (ref 38–126)
Anion gap: 16 — ABNORMAL HIGH (ref 5–15)
BUN: 17 mg/dL (ref 6–20)
CO2: 29 mmol/L (ref 22–32)
Calcium: 10.1 mg/dL (ref 8.9–10.3)
Chloride: 92 mmol/L — ABNORMAL LOW (ref 98–111)
Creatinine, Ser: 1.14 mg/dL (ref 0.61–1.24)
GFR calc Af Amer: >60 mL/min (ref >60)
GFR calc non Af Amer: >60 mL/min (ref >60)
Glucose, Bld: 135 mg/dL — ABNORMAL HIGH (ref 70–99)
Potassium: 3.2 mmol/L — ABNORMAL LOW (ref 3.5–5.1)
Sodium: 137 mmol/L (ref 135–145)
Total Bilirubin: 1.5 mg/dL — ABNORMAL HIGH (ref 0.3–1.2)
Total Protein: 8.9 g/dL — ABNORMAL HIGH (ref 6.5–8.1)

## 2019-11-20 LAB — URINE DRUG SCREEN, QUALITATIVE (ARMC ONLY)
Amphetamines, Ur Screen: NOT DETECTED
Barbiturates, Ur Screen: NOT DETECTED
Benzodiazepine, Ur Scrn: NOT DETECTED
Cannabinoid 50 Ng, Ur ~~LOC~~: POSITIVE — AB
Cocaine Metabolite,Ur ~~LOC~~: NOT DETECTED
MDMA (Ecstasy)Ur Screen: NOT DETECTED
Methadone Scn, Ur: NOT DETECTED
Opiate, Ur Screen: NOT DETECTED
Phencyclidine (PCP) Ur S: NOT DETECTED
Tricyclic, Ur Screen: NOT DETECTED

## 2019-11-20 LAB — URINALYSIS, ROUTINE W REFLEX MICROSCOPIC
Bacteria, UA: NONE SEEN
Bilirubin Urine: NEGATIVE
Glucose, UA: NEGATIVE mg/dL
Hgb urine dipstick: NEGATIVE
Ketones, ur: 5 mg/dL — AB
Leukocytes,Ua: NEGATIVE
Nitrite: NEGATIVE
Protein, ur: 100 mg/dL — AB
Specific Gravity, Urine: 1.033 — ABNORMAL HIGH (ref 1.005–1.030)
Squamous Epithelial / HPF: NONE SEEN (ref 0–5)
pH: 6 (ref 5.0–8.0)

## 2019-11-20 LAB — LIPASE, BLOOD: Lipase: 31 U/L (ref 11–51)

## 2019-11-20 MED ORDER — SODIUM CHLORIDE 0.9 % IV BOLUS
1000.0000 mL | Freq: Once | INTRAVENOUS | Status: AC
  Administered 2019-11-20: 14:00:00 1000 mL via INTRAVENOUS

## 2019-11-20 MED ORDER — LACTATED RINGERS IV BOLUS
1000.0000 mL | Freq: Once | INTRAVENOUS | Status: AC
  Administered 2019-11-20: 16:00:00 1000 mL via INTRAVENOUS

## 2019-11-20 MED ORDER — ONDANSETRON HCL 4 MG/2ML IJ SOLN
4.0000 mg | Freq: Once | INTRAMUSCULAR | Status: AC
  Administered 2019-11-20: 14:00:00 4 mg via INTRAVENOUS
  Filled 2019-11-20: qty 2

## 2019-11-20 MED ORDER — LACTATED RINGERS IV BOLUS: 1000.0000 mL | Freq: Once | INTRAVENOUS | Status: DC

## 2019-11-20 MED ORDER — DROPERIDOL 2.5 MG/ML IJ SOLN
2.5000 mg | Freq: Once | INTRAMUSCULAR | Status: AC
  Administered 2019-11-20: 16:00:00 2.5 mg via INTRAVENOUS
  Filled 2019-11-20: qty 2

## 2019-11-20 MED ORDER — PROMETHAZINE HCL 25 MG PO TABS: 25.0000 mg | ORAL_TABLET | Freq: Three times a day (TID) | ORAL | 0 refills | Status: AC | PRN

## 2019-11-20 NOTE — ED Provider Notes (Signed)
[facility] Emergency Department Provider Note  ____________________________________________   First MD Initiated Contact with Patient 11/20/19 1544     (approximate)  I have reviewed the triage vital signs and the nursing notes.   HISTORY  Chief Complaint Emesis    HPI Peter Suarez is a 28 y.o. male  With h/o cannabinoid hyperemesis here with nausea, vomiting. Pt states he has been under increased stress at work, recently smoked marijuana this week. Since then, he's had persistent, severe n/v with inability to eat/drink. Feels similar to his prior episodes of CHS. No fever, chills, diarrhea. No sick contacts. Abd pain is epigastric, cramp like, intermittent. Worse with vomiting, eating, no alleviating factors. No lower abd TTP. No urinary sx.        Past Medical History:  Diagnosis Date  . Anxiety   . Cannabinoid hyperemesis syndrome   . GERD (gastroesophageal reflux disease)   . Nausea and vomiting    chronic, recurrent    Patient Active Problem List   Diagnosis Date Noted  . Protein-calorie malnutrition, severe ([place]) 01/12/2015  . Intractable nausea and vomiting 01/11/2015  . Erosive gastritis 01/11/2015  . GERD (gastroesophageal reflux disease) 01/11/2015  . Nausea & vomiting 01/11/2015  . Nausea with vomiting 10/30/2014  . Loss of weight 10/30/2014    Past Surgical History:  Procedure Laterality Date  . ESOPHAGOGASTRODUODENOSCOPY  10/31/2014    Prior to Admission medications   Medication Sig Start Date End Date Taking? Authorizing Provider  ondansetron (ZOFRAN ODT) 4 MG disintegrating tablet Take 1 tablet (4 mg total) by mouth every 6 (six) hours as needed for nausea or vomiting. 06/25/19   Delman Kitten, MD  potassium chloride (K-DUR) 10 MEQ tablet Take 1 tablet (10 mEq total) by mouth daily. 06/25/19   Delman Kitten, MD  promethazine (PHENERGAN) 25 MG tablet Take 1 tablet (25 mg total) by mouth every 8 (eight) hours as needed for  nausea or vomiting. 11/20/19   Duffy Bruce, MD    Allergies Lorazepam  Family History  Problem Relation Age of Onset  . High blood pressure Maternal Grandmother     Social History Social History   Tobacco Use  . Smoking status: Never Smoker  . Smokeless tobacco: Never Used  Substance Use Topics  . Alcohol use: No    Alcohol/week: 0.0 standard drinks    Comment: Rarely  . Drug use: Yes    Frequency: 7.0 times per week    Types: Marijuana    Review of Systems  Review of Systems  Constitutional: Positive for fatigue. Negative for chills and fever.  HENT: Negative for sore throat.   Respiratory: Negative for shortness of breath.   Cardiovascular: Negative for chest pain.  Gastrointestinal: Positive for abdominal pain, nausea and vomiting.  Genitourinary: Negative for flank pain.  Musculoskeletal: Negative for neck pain.  Skin: Negative for rash and wound.  Allergic/Immunologic: Negative for immunocompromised state.  Neurological: Positive for weakness. Negative for numbness.  Hematological: Does not bruise/bleed easily.  All other systems reviewed and are negative.    ____________________________________________  PHYSICAL EXAM:      VITAL SIGNS: ED Triage Vitals [11/20/19 1358]  Enc Vitals Group     BP 120/79     Pulse Rate 66     Resp 16     Temp 99.4 F (37.4 C)     Temp Source Oral     SpO2 99 %     Weight 225 lb (102.1 kg)  Height  (1.88 m)     Head Circumference      Peak Flow      Pain Score 0     Pain Loc      Pain Edu?      Excl. in [place]?      Physical Exam Vitals signs and nursing note reviewed.  Constitutional:      General: He is not in acute distress.    Appearance: He is well-developed.  HENT:     Head: Normocephalic and atraumatic.     Mouth/Throat:     Mouth: Mucous membranes are dry.  Eyes:     Conjunctiva/sclera: Conjunctivae normal.  Neck:     Musculoskeletal: Neck supple.  Cardiovascular:     Rate and Rhythm:  Normal rate and regular rhythm.     Heart sounds: Normal heart sounds. No murmur. No friction rub.  Pulmonary:     Effort: Pulmonary effort is normal. No respiratory distress.     Breath sounds: Normal breath sounds. No wheezing or rales.  Abdominal:     General: Abdomen is flat. There is no distension.     Palpations: Abdomen is soft.     Tenderness: There is abdominal tenderness (minimal, diffuse). There is no guarding or rebound.  Skin:    General: Skin is warm.     Capillary Refill: Capillary refill takes less than 2 seconds.  Neurological:     Mental Status: He is alert and oriented to person, place, and time.     Motor: No abnormal muscle tone.       ____________________________________________   LABS (all labs ordered are listed, but only abnormal results are displayed)  Labs Reviewed  CBC WITH DIFFERENTIAL/PLATELET - Abnormal; Notable for the following components:      Result Value   WBC 10.6 (*)    RBC 5.88 (*)    Hemoglobin 17.9 (*)    Neutro Abs 7.8 (*)    Monocytes Absolute 1.1 (*)    All other components within normal limits  COMPREHENSIVE METABOLIC PANEL - Abnormal; Notable for the following components:   Potassium 3.2 (*)    Chloride 92 (*)    Glucose, Bld 135 (*)    Total Protein 8.9 (*)    Albumin 5.4 (*)    ALT 56 (*)    Total Bilirubin 1.5 (*)    Anion gap 16 (*)    All other components within normal limits  URINE DRUG SCREEN, QUALITATIVE ([facility] ONLY) - Abnormal; Notable for the following components:   Cannabinoid 50 Ng, Ur [place] POSITIVE (*)    All other components within normal limits  URINALYSIS, ROUTINE W REFLEX MICROSCOPIC - Abnormal; Notable for the following components:   Color, Urine AMBER (*)    APPearance HAZY (*)    Specific Gravity, Urine 1.033 (*)    Ketones, ur 5 (*)    Protein, ur 100 (*)    All other components within normal limits  LIPASE, BLOOD    ____________________________________________  EKG: None  ________________________________________  RADIOLOGY All imaging, including plain films, CT scans, and ultrasounds, independently reviewed by me, and interpretations confirmed via formal radiology reads.  ED MD interpretation:   None  Official radiology report(s): No results found.  ____________________________________________  PROCEDURES   Procedure(s) performed (including Critical Care):  Procedures  ____________________________________________  INITIAL IMPRESSION / MDM / ASSESSMENT AND PLAN / ED COURSE  As part of my medical decision making, I reviewed the following data within the electronic medical  record:  Nursing notes reviewed and incorporated, Old chart reviewed, Notes from prior ED visits, and [place] Controlled Substance Database       *KIMBERLY COYE was evaluated in Emergency Department on 11/20/2019 for the symptoms described in the history of present illness. He was evaluated in the context of the global COVID-19 pandemic, which necessitated consideration that the patient might be at risk for infection with the SARS-CoV-2 virus that causes COVID-19. Institutional protocols and algorithms that pertain to the evaluation of patients at risk for COVID-19 are in a state of rapid change based on information released by regulatory bodies including the CDC and federal and state organizations. These policies and algorithms were followed during the patient's care in the ED.  Some ED evaluations and interventions may be delayed as a result of limited staffing during the pandemic.*     Medical Decision Making:  28 yo M here with recurrent nausea, vomiting. Likely cannabinoid hyperemesis with h/o same. No focal TTP to suggest appendicitis, cholecystitis, or surgical abnormality. No fever, mild leukocytosis is likely stress/reactive and there is no left shift. UA and CMP c/w dehydration and vomiting. Will give droperidol, LR fluids, reassess.   ____________________________________________  FINAL CLINICAL IMPRESSION(S) / ED DIAGNOSES  Final diagnoses:  Cannabinoid hyperemesis syndrome  Dehydration     MEDICATIONS GIVEN DURING THIS VISIT:  Medications  sodium chloride 0.9 % bolus 1,000 mL (0 mLs Intravenous Stopped 11/20/19 1602)  ondansetron (ZOFRAN) injection 4 mg (4 mg Intravenous Given 11/20/19 1413)  droperidol (INAPSINE) 2.5 MG/ML injection 2.5 mg (2.5 mg Intravenous Given 11/20/19 1610)  lactated ringers bolus 1,000 mL (1,000 mLs Intravenous New Bag/Given 11/20/19 1610)     ED Discharge Orders         Ordered    promethazine (PHENERGAN) 25 MG tablet  Every 8 hours PRN     11/20/19 1740           Note:  This document was prepared using Dragon voice recognition software and may include unintentional dictation errors.   Shaune Pollack, MD 11/20/19 1740

## 2019-11-20 NOTE — ED Provider Notes (Signed)
[facility] Emergency Department Provider Note  ____________________________________________   None    (approximate)   I have reviewed the triage vital signs and the nursing notes.   Patient has been triaged with a MSE exam performed by myself at a minimum. Based on symptoms and screening exam, patient may receive a more in-depth exam, labs, imaging as detailed below. Patients have been advised of this setting and exam type at the time of patient interview.    HISTORY  Chief Complaint Emesis    HPI Peter Suarez is a 28 y.o. male presents to the emergency department with a complaint of vomiting and hyperemesis.  Patient states he has been under a lot of stress at work.  States he smoked about an ounce of marijuana over the past week.  He thinks he is having hyperemesis secondary to the marijuana.  He denies any fever or chills.  No chest pain or shortness of breath.  He does have abdominal pain.  States everything hurts..   Patient will receive a medical screening exam as detailed below.  Based off of this exam, more in depth exam, labs, imaging will be performed as needed for complaint.  Patient care will be eventually transferred to another provider in the emergency department for final exam, diagnosis and disposition.    Past Medical History:  Diagnosis Date   Anxiety    Cannabinoid hyperemesis syndrome    GERD (gastroesophageal reflux disease)    Nausea and vomiting    chronic, recurrent    Patient Active Problem List   Diagnosis Date Noted   Protein-calorie malnutrition, severe ([place]) 01/12/2015   Intractable nausea and vomiting 01/11/2015   Erosive gastritis 01/11/2015   GERD (gastroesophageal reflux disease) 01/11/2015   Nausea & vomiting 01/11/2015   Nausea with vomiting 10/30/2014   Loss of weight 10/30/2014    Past Surgical History:  Procedure Laterality Date   ESOPHAGOGASTRODUODENOSCOPY  10/31/2014    Prior to  Admission medications   Medication Sig Start Date End Date Taking? Authorizing Provider  ondansetron (ZOFRAN ODT) 4 MG disintegrating tablet Take 1 tablet (4 mg total) by mouth every 6 (six) hours as needed for nausea or vomiting. 06/25/19   Delman Kitten, MD  potassium chloride (K-DUR) 10 MEQ tablet Take 1 tablet (10 mEq total) by mouth daily. 06/25/19   Delman Kitten, MD  promethazine (PHENERGAN) 25 MG tablet Take 1 tablet (25 mg total) by mouth every 6 (six) hours as needed for nausea or vomiting. 01/30/19   Sherwood Gambler, MD    Allergies Lorazepam  Family History  Problem Relation Age of Onset   High blood pressure Maternal Grandmother     Social History Social History   Tobacco Use   Smoking status: Never Smoker   Smokeless tobacco: Never Used  Substance Use Topics   Alcohol use: No    Alcohol/week: 0.0 standard drinks    Comment: Rarely   Drug use: Yes    Frequency: 7.0 times per week    Types: Marijuana    Review of Systems Constitutional: Denies fever ENT: Denies nasal congestion/rhinorhea.  Denies sore throat Cardiovascular: Denies chest pain. Respiratory: Denies cough.  Denies shortness of breath/difficulty breathing Gastroenterology: Positive for vomiting and abdominal pain Musculoskeletal: Denies for musculoskeletal pain Integumentary: Negative for rash. Neurological: No focal weakness nor numbness.   ____________________________________________   PHYSICAL EXAM:  VITAL SIGNS: ED Triage Vitals [11/20/19 1358]  Enc Vitals Group     BP 120/79  Pulse Rate 66     Resp 16     Temp 99.4 F (37.4 C)     Temp Source Oral     SpO2 99 %     Weight 225 lb (102.1 kg)     Height 6\' 2"  (1.88 m)     Head Circumference      Peak Flow      Pain Score 0     Pain Loc      Pain Edu?      Excl. in [place]?     Constitutional: Alert and oriented. Generally well appearing and in no acute distress. Eyes: Conjunctivae are normal.  Nose: No significant  congestion/rhinnorhea. Mouth: No gross oropharyngeal edema.  Neck: No stridor.  No meningeal signs.   Cardiovascular: Grossly normal heart sounds. Respiratory: Normal respiratory effort without significant tachypnea and no observed retractions. Lungs CTA Gastrointestinal: No significant visible abdominal wall findings.  Bowel sounds x4 quadrants.  Entire abdomen is tender to palpation. Musculoskeletal: No gross deformities of extremities. Neurologic:  Normal speech and language. No gross focal neurologic deficits are appreciated.  Skin:  Skin is warm, dry and intact. No rash noted.    ____________________________________________   LABS (all labs ordered are listed, but only abnormal results are displayed)  Labs Reviewed  CBC WITH DIFFERENTIAL/PLATELET  COMPREHENSIVE METABOLIC PANEL  LIPASE, BLOOD  URINE DRUG SCREEN, QUALITATIVE ([facility] ONLY)    ____________________________________________   RADIOLOGY   Official radiology report(s): No results found.  ____________________________________________    INITIAL IMPRESSION / MDM / ASSESSMENT AND PLAN / ED COURSE  As part of my medical decision making, I reviewed the following data within the electronic MEDICAL RECORD NUMBER       Clinical Impression: n/v secondary to marijuana     Patient has been screened based based on their arrival complaint, evaluated for an emergent condition, and at a minimum has received a medical screening exam.  At this time, patient will receive further work-up as determined by medical screening exam.  Patient care will eventually be transferred to another provider in the emergency department for final diagnosis and disposition.    ____________________________________________  Note:  This document was prepared using and may include unintentional dictation errors.    Conservation officer, historic buildings, PA-C 11/20/19 1408    11/22/19, MD 11/20/19 [phone]

## 2019-11-20 NOTE — ED Triage Notes (Signed)
Pt here with c/o vomiting for 5-6 days ago, was seen at urgent care Wed and got IV fluids, is feeling dehydrated today, kidneys are "aching." Appears pale, vomit bag with brown emesis present. NAD.

## 2019-11-20 NOTE — ED Notes (Signed)
Pt has Cannabinoid Hyperemesis was diagnosed one other time before.

## 2019-12-23 ENCOUNTER — Encounter: Payer: Self-pay | Admitting: Emergency Medicine

## 2019-12-23 ENCOUNTER — Emergency Department
Admission: EM | Admit: 2019-12-23 | Discharge: 2019-12-23 | Disposition: A | Discharge status: Home / Self Care | Payer: BC Managed Care – PPO | Attending: Student | Admitting: Student

## 2019-12-23 ENCOUNTER — Other Ambulatory Visit: Payer: Self-pay

## 2019-12-23 DIAGNOSIS — R112 Nausea with vomiting, unspecified: Secondary | ICD-10-CM

## 2019-12-23 DIAGNOSIS — F12988 Cannabis use, unspecified with other cannabis-induced disorder: Secondary | ICD-10-CM | POA: Diagnosis not present

## 2019-12-23 DIAGNOSIS — R1110 Vomiting, unspecified: Secondary | ICD-10-CM | POA: Diagnosis present

## 2019-12-23 LAB — CBC
HCT: 49.2 % (ref 39.0–52.0)
Hemoglobin: 17.3 g/dL — ABNORMAL HIGH (ref 13.0–17.0)
MCH: 29.9 pg (ref 26.0–34.0)
MCHC: 35.2 g/dL (ref 30.0–36.0)
MCV: 85 fL (ref 80.0–100.0)
Platelets: 347 10*3/uL (ref 150–400)
RBC: 5.79 MIL/uL (ref 4.22–5.81)
RDW: 11.9 % (ref 11.5–15.5)
WBC: 13.3 10*3/uL — ABNORMAL HIGH (ref 4.0–10.5)
nRBC: 0 % (ref 0.0–0.2)

## 2019-12-23 LAB — URINALYSIS, COMPLETE (UACMP) WITH MICROSCOPIC
Bacteria, UA: NONE SEEN
Bilirubin Urine: NEGATIVE
Glucose, UA: NEGATIVE mg/dL
Hgb urine dipstick: NEGATIVE
Ketones, ur: 20 mg/dL — AB
Leukocytes,Ua: NEGATIVE
Nitrite: NEGATIVE
Protein, ur: 100 mg/dL — AB
Specific Gravity, Urine: 1.032 — ABNORMAL HIGH (ref 1.005–1.030)
pH: 5 (ref 5.0–8.0)

## 2019-12-23 LAB — COMPREHENSIVE METABOLIC PANEL
ALT: 26 U/L (ref 0–44)
AST: 18 U/L (ref 15–41)
Albumin: 5.5 g/dL — ABNORMAL HIGH (ref 3.5–5.0)
Alkaline Phosphatase: 94 U/L (ref 38–126)
Anion gap: 17 — ABNORMAL HIGH (ref 5–15)
BUN: 28 mg/dL — ABNORMAL HIGH (ref 6–20)
CO2: 22 mmol/L (ref 22–32)
Calcium: 10.3 mg/dL (ref 8.9–10.3)
Chloride: 101 mmol/L (ref 98–111)
Creatinine, Ser: 1.22 mg/dL (ref 0.61–1.24)
GFR calc Af Amer: >60 mL/min (ref >60)
GFR calc non Af Amer: >60 mL/min (ref >60)
Glucose, Bld: 165 mg/dL — ABNORMAL HIGH (ref 70–99)
Potassium: 3.8 mmol/L (ref 3.5–5.1)
Sodium: 140 mmol/L (ref 135–145)
Total Bilirubin: 1 mg/dL (ref 0.3–1.2)
Total Protein: 9.6 g/dL — ABNORMAL HIGH (ref 6.5–8.1)

## 2019-12-23 LAB — LIPASE, BLOOD: Lipase: 25 U/L (ref 11–51)

## 2019-12-23 MED ORDER — DROPERIDOL 2.5 MG/ML IJ SOLN
2.5000 mg | Freq: Once | INTRAMUSCULAR | Status: AC
  Administered 2019-12-23: 2.5 mg via INTRAVENOUS
  Filled 2019-12-23: qty 2

## 2019-12-23 MED ORDER — ONDANSETRON 4 MG PO TBDP: 4.0000 mg | ORAL_TABLET | Freq: Three times a day (TID) | ORAL | 0 refills | Status: AC | PRN

## 2019-12-23 MED ORDER — LACTATED RINGERS IV BOLUS
1000.0000 mL | Freq: Once | INTRAVENOUS | Status: AC
  Administered 2019-12-23: 1000 mL via INTRAVENOUS

## 2019-12-23 MED ORDER — ONDANSETRON 4 MG PO TBDP
4.0000 mg | ORAL_TABLET | Freq: Once | ORAL | Status: AC | PRN
  Administered 2019-12-23: 4 mg via ORAL
  Filled 2019-12-23: qty 1

## 2019-12-23 MED ORDER — SODIUM CHLORIDE 0.9 % IV BOLUS: 1000.0000 mL | Freq: Once | INTRAVENOUS | Status: DC

## 2019-12-23 NOTE — ED Triage Notes (Addendum)
Pt arrived via POV with reports of nausea and vomiting x 4 days. Pt c/o abdominal discomfort as well.  Pt states he has not had anything for the N/V states nothing stays down.   Pt has hx of hyperemesis related to cannabis use. Pt denies any marijuana use this week.

## 2019-12-23 NOTE — ED Notes (Signed)
Pt in bathroom to collect urine sample.

## 2019-12-23 NOTE — ED Provider Notes (Signed)
[facility] Emergency Department Provider Note ____________________________________________   First MD Initiated Contact with Patient 12/23/19 1735     (approximate)  I have reviewed the triage vital signs and the nursing notes.   HISTORY  Chief Complaint Emesis and Nausea  HPI Peter Suarez is a 28 y.o. male presents to the emergency department for treatment and evaluation of persistent vomiting.  Patient states that he was here approximately 1 month ago with same symptoms.  He was treated with medications "similar to Haldol" and had complete relief.  He states that he smoked marijuana about a week ago and vomiting has been triggered once again.  He has been unable to keep anything down.  Last use of marijuana was 1 week ago.  Patient states that it is a "stupid stress reliever."         Past Medical History:  Diagnosis Date  . Anxiety   . Cannabinoid hyperemesis syndrome   . GERD (gastroesophageal reflux disease)   . Nausea and vomiting    chronic, recurrent    Patient Active Problem List   Diagnosis Date Noted  . Protein-calorie malnutrition, severe (HCC) 01/12/2015  . Intractable nausea and vomiting 01/11/2015  . Erosive gastritis 01/11/2015  . GERD (gastroesophageal reflux disease) 01/11/2015  . Nausea & vomiting 01/11/2015  . Nausea with vomiting 10/30/2014  . Loss of weight 10/30/2014    Past Surgical History:  Procedure Laterality Date  . ESOPHAGOGASTRODUODENOSCOPY  10/31/2014    Prior to Admission medications   Medication Sig Start Date End Date Taking? Authorizing Provider  ondansetron (ZOFRAN-ODT) 4 MG disintegrating tablet Take 1 tablet (4 mg total) by mouth every 8 (eight) hours as needed for nausea or vomiting. 12/23/19   Shakinah Navis, Rulon Eisenmengerari B, FNP  potassium chloride (K-DUR) 10 MEQ tablet Take 1 tablet (10 mEq total) by mouth daily. 06/25/19   Sharyn CreamerQuale, Librado, MD  promethazine (PHENERGAN) 25 MG tablet Take 1 tablet (25 mg total) by  mouth every 8 (eight) hours as needed for nausea or vomiting. 11/20/19   Shaune PollackIsaacs, Cameron, MD    Allergies Lorazepam  Family History  Problem Relation Age of Onset  . High blood pressure Maternal Grandmother     Social History Social History   Tobacco Use  . Smoking status: Never Smoker  . Smokeless tobacco: Never Used  Substance Use Topics  . Alcohol use: No    Alcohol/week: 0.0 standard drinks    Comment: Rarely  . Drug use: Yes    Frequency: 7.0 times per week    Types: Marijuana    Review of Systems  Constitutional: No fever/chills Eyes: No visual changes. ENT: No sore throat. Cardiovascular: Denies chest pain. Respiratory: Denies shortness of breath. Gastrointestinal: Positive for abdominal cramping.  Positive for nausea and vomiting.  No diarrhea.  No constipation.   Genitourinary: Negative for dysuria. Musculoskeletal: Negative for back pain. Skin: Negative for rash. Neurological: Negative for headaches, focal weakness or numbness. ____________________________________________   PHYSICAL EXAM:  VITAL SIGNS: ED Triage Vitals  Enc Vitals Group     BP 12/23/19 1522 118/86     Pulse Rate 12/23/19 1522 91     Resp 12/23/19 1522 18     Temp 12/23/19 1522 98.8 F (37.1 C)     Temp Source 12/23/19 1522 Oral     SpO2 12/23/19 1522 97 %     Weight 12/23/19 1521 215 lb (97.5 kg)     Height 12/23/19 1521 6\' 2"  (1.88 m)  Head Circumference --      Peak Flow --      Pain Score 12/23/19 1521 8     Pain Loc --      Pain Edu? --      Excl. in GC? --     Constitutional: Alert and oriented. Well appearing and in no acute distress. Eyes: Conjunctivae are normal. PERRL. EOMI. Head: Atraumatic. Nose: No congestion/rhinnorhea. Mouth/Throat: Mucous membranes are moist.  Oropharynx non-erythematous. Neck: No stridor.   Hematological/Lymphatic/Immunilogical: No cervical lymphadenopathy. Cardiovascular: Normal rate, regular rhythm. Grossly normal heart sounds.  Good  peripheral circulation. Respiratory: Normal respiratory effort.  No retractions. Lungs CTAB. Gastrointestinal: Soft and nontender. No distention. No abdominal bruits. No CVA tenderness. Genitourinary:  Musculoskeletal: No lower extremity tenderness nor edema.  No joint effusions. Neurologic:  Normal speech and language. No gross focal neurologic deficits are appreciated. No gait instability. Skin:  Skin is warm, dry and intact. No rash noted. Psychiatric: Mood and affect are normal. Speech and behavior are normal.  ____________________________________________   LABS (all labs ordered are listed, but only abnormal results are displayed)  Labs Reviewed  COMPREHENSIVE METABOLIC PANEL - Abnormal; Notable for the following components:      Result Value   Glucose, Bld 165 (*)    BUN 28 (*)    Total Protein 9.6 (*)    Albumin 5.5 (*)    Anion gap 17 (*)    All other components within normal limits  CBC - Abnormal; Notable for the following components:   WBC 13.3 (*)    Hemoglobin 17.3 (*)    All other components within normal limits  URINALYSIS, COMPLETE (UACMP) WITH MICROSCOPIC - Abnormal; Notable for the following components:   Color, Urine YELLOW (*)    APPearance HAZY (*)    Specific Gravity, Urine 1.032 (*)    Ketones, ur 20 (*)    Protein, ur 100 (*)    All other components within normal limits  LIPASE, BLOOD   ____________________________________________  EKG  Not indicated ____________________________________________  RADIOLOGY  ED MD interpretation:    Not indicated  Official radiology report(s): No results found.  ____________________________________________   PROCEDURES  Procedure(s) performed (including Critical Care):  Procedures  ____________________________________________   INITIAL IMPRESSION / ASSESSMENT AND PLAN     28 year old male presenting to the emergency department for treatment and evaluation of nausea and vomiting likely secondary  to recurrent cannabis use.  See HPI for further details.  Plan will be to provide hydration with some lactated Ringer's and give him droperidol.  DIFFERENTIAL DIAGNOSIS  Cannabinoid hyperemesis syndrome, viral gastroenteritis, dehydration  ED COURSE  Patient feeling much better and has been able to sleep. Repeat BMP ordered to recheck anion gap. He is currently eating ice chips.  Lab reports that the repeat BMP hemolyzed.  Patient wants to be discharged.  He appears well.  He appears stable for discharge.  He was advised that to avoid recurrent symptoms that he should not smoke marijuana.  Patient states that he is aware of this.  He was offered information for [facility] but states that he is not interested at this time.  He was advised to return to the emergency department at any point for symptoms that change or worsen or for new concerns. ____________________________________________   FINAL CLINICAL IMPRESSION(S) / ED DIAGNOSES  Final diagnoses:  Cannabinoid hyperemesis syndrome     ED Discharge Orders         Ordered    ondansetron (ZOFRAN-ODT) 4  MG disintegrating tablet  Every 8 hours PRN     12/23/19 2045           Note:  This document was prepared using Dragon voice recognition software and may include unintentional dictation errors.   Victorino Dike, FNP 12/25/19 1808    Lilia Pro., MD 12/25/19 [phone]

## 2019-12-27 ENCOUNTER — Other Ambulatory Visit: Payer: Self-pay

## 2019-12-27 ENCOUNTER — Emergency Department
Admission: EM | Admit: 2019-12-27 | Discharge: 2019-12-27 | Disposition: A | Discharge status: Home / Self Care | Payer: BC Managed Care – PPO | Attending: Emergency Medicine | Admitting: Emergency Medicine

## 2019-12-27 DIAGNOSIS — G43A0 Cyclical vomiting, not intractable: Secondary | ICD-10-CM | POA: Diagnosis not present

## 2019-12-27 DIAGNOSIS — R1115 Cyclical vomiting syndrome unrelated to migraine: Secondary | ICD-10-CM

## 2019-12-27 DIAGNOSIS — R1084 Generalized abdominal pain: Secondary | ICD-10-CM | POA: Diagnosis present

## 2019-12-27 LAB — URINALYSIS, COMPLETE (UACMP) WITH MICROSCOPIC
Bacteria, UA: NONE SEEN
Bilirubin Urine: NEGATIVE
Glucose, UA: NEGATIVE mg/dL
Hgb urine dipstick: NEGATIVE
Ketones, ur: NEGATIVE mg/dL
Leukocytes,Ua: NEGATIVE
Nitrite: NEGATIVE
Protein, ur: 30 mg/dL — AB
Specific Gravity, Urine: 1.025 (ref 1.005–1.030)
Squamous Epithelial / HPF: NONE SEEN (ref 0–5)
pH: 6 (ref 5.0–8.0)

## 2019-12-27 LAB — CBC
HCT: 50.4 % (ref 39.0–52.0)
Hemoglobin: 17.6 g/dL — ABNORMAL HIGH (ref 13.0–17.0)
MCH: 29.8 pg (ref 26.0–34.0)
MCHC: 34.9 g/dL (ref 30.0–36.0)
MCV: 85.3 fL (ref 80.0–100.0)
Platelets: 320 10*3/uL (ref 150–400)
RBC: 5.91 MIL/uL — ABNORMAL HIGH (ref 4.22–5.81)
RDW: 11.6 % (ref 11.5–15.5)
WBC: 10.2 10*3/uL (ref 4.0–10.5)
nRBC: 0 % (ref 0.0–0.2)

## 2019-12-27 LAB — COMPREHENSIVE METABOLIC PANEL
ALT: 77 U/L — ABNORMAL HIGH (ref 0–44)
AST: 47 U/L — ABNORMAL HIGH (ref 15–41)
Albumin: 5.2 g/dL — ABNORMAL HIGH (ref 3.5–5.0)
Alkaline Phosphatase: 84 U/L (ref 38–126)
Anion gap: 17 — ABNORMAL HIGH (ref 5–15)
BUN: 22 mg/dL — ABNORMAL HIGH (ref 6–20)
CO2: 30 mmol/L (ref 22–32)
Calcium: 10.1 mg/dL (ref 8.9–10.3)
Chloride: 92 mmol/L — ABNORMAL LOW (ref 98–111)
Creatinine, Ser: 1.17 mg/dL (ref 0.61–1.24)
GFR calc Af Amer: >60 mL/min (ref >60)
GFR calc non Af Amer: >60 mL/min (ref >60)
Glucose, Bld: 148 mg/dL — ABNORMAL HIGH (ref 70–99)
Potassium: 3 mmol/L — ABNORMAL LOW (ref 3.5–5.1)
Sodium: 139 mmol/L (ref 135–145)
Total Bilirubin: 1.5 mg/dL — ABNORMAL HIGH (ref 0.3–1.2)
Total Protein: 9.1 g/dL — ABNORMAL HIGH (ref 6.5–8.1)

## 2019-12-27 LAB — LIPASE, BLOOD: Lipase: 30 U/L (ref 11–51)

## 2019-12-27 MED ORDER — ONDANSETRON 4 MG PO TBDP
4.0000 mg | ORAL_TABLET | Freq: Once | ORAL | Status: AC | PRN
  Administered 2019-12-27: 4 mg via ORAL
  Filled 2019-12-27: qty 1

## 2019-12-27 MED ORDER — DROPERIDOL 2.5 MG/ML IJ SOLN
1.2500 mg | Freq: Once | INTRAMUSCULAR | Status: AC
  Administered 2019-12-27: 1.25 mg via INTRAVENOUS
  Filled 2019-12-27: qty 2

## 2019-12-27 MED ORDER — SODIUM CHLORIDE 0.9 % IV SOLN
1000.0000 mL | Freq: Once | INTRAVENOUS | Status: AC
  Administered 2019-12-27: 1000 mL via INTRAVENOUS

## 2019-12-27 MED ORDER — SODIUM CHLORIDE 0.9% FLUSH
3.0000 mL | Freq: Once | INTRAVENOUS | Status: AC
  Administered 2019-12-27: 3 mL via INTRAVENOUS

## 2019-12-27 NOTE — ED Provider Notes (Signed)
[facility] Emergency Department Provider Note   ____________________________________________    I have reviewed the triage vital signs and the nursing notes.   HISTORY  Chief Complaint Nausea vomiting    HPI Peter Suarez is a 28 y.o. male who presents with complaints of nausea and vomiting.  Patient has had repetitive nausea and vomiting over the last several days.  He reports this is likely related to cannabinoid hyperemesis syndrome, he reports last having smoked marijuana was 1 week ago.  He was seen in the emergency department that time and had relief after being treated here but he reports it recurred several days ago.  Reports mild diffuse abdominal discomfort.  Taking a hot shower does help briefly  Past Medical History:  Diagnosis Date  . Anxiety   . Cannabinoid hyperemesis syndrome   . GERD (gastroesophageal reflux disease)   . Nausea and vomiting    chronic, recurrent    Patient Active Problem List   Diagnosis Date Noted  . Protein-calorie malnutrition, severe ([place]) 01/12/2015  . Intractable nausea and vomiting 01/11/2015  . Erosive gastritis 01/11/2015  . GERD (gastroesophageal reflux disease) 01/11/2015  . Nausea & vomiting 01/11/2015  . Nausea with vomiting 10/30/2014  . Loss of weight 10/30/2014    Past Surgical History:  Procedure Laterality Date  . ESOPHAGOGASTRODUODENOSCOPY  10/31/2014    Prior to Admission medications   Medication Sig Start Date End Date Taking? Authorizing Provider  ondansetron (ZOFRAN-ODT) 4 MG disintegrating tablet Take 1 tablet (4 mg total) by mouth every 8 (eight) hours as needed for nausea or vomiting. 12/23/19   Triplett, Johnette Abraham B, FNP  potassium chloride (K-DUR) 10 MEQ tablet Take 1 tablet (10 mEq total) by mouth daily. 06/25/19   Delman Kitten, MD  promethazine (PHENERGAN) 25 MG tablet Take 1 tablet (25 mg total) by mouth every 8 (eight) hours as needed for nausea or vomiting. 11/20/19   Duffy Bruce, MD     Allergies Lorazepam  Family History  Problem Relation Age of Onset  . High blood pressure Maternal Grandmother     Social History Social History   Tobacco Use  . Smoking status: Never Smoker  . Smokeless tobacco: Never Used  Substance Use Topics  . Alcohol use: No    Alcohol/week: 0.0 standard drinks    Comment: Rarely  . Drug use: Yes    Frequency: 7.0 times per week    Types: Marijuana    Review of Systems  Constitutional: No fever/chills Eyes: No visual changes.  ENT: No sore throat. Cardiovascular: Denies chest pain. Respiratory: No cough Gastrointestinal: As above Genitourinary: Negative for dysuria. Musculoskeletal: Negative for back pain. Skin: Negative for rash. Neurological: Negative for headaches    ____________________________________________   PHYSICAL EXAM:  VITAL SIGNS: ED Triage Vitals  Enc Vitals Group     BP 12/27/19 0919 124/87     Pulse Rate 12/27/19 0919 84     Resp 12/27/19 0919 17     Temp 12/27/19 0922 97.9 F (36.6 C)     Temp Source 12/27/19 0919 Oral     SpO2 12/27/19 0919 96 %     Weight 12/27/19 0920 97.5 kg (215 lb)     Height 12/27/19 0920 1.88 m (6\' 2" )     Head Circumference --      Peak Flow --      Pain Score 12/27/19 0920 8     Pain Loc --      Pain Edu? --  Excl. in GC? --     Constitutional: Alert and oriented. e  Nose: No congestion  Cardiovascular: Normal rate, regular rhythm. Peri [place] peripheral circulation. Respiratory: Normal respiratory effort.  No retractions.  Gastrointestinal: Soft and nontender. No distention.    Musculoskeletal:   Warm and well perfused Neurologic:  Normal speech and language. No gross focal neurologic deficits are appreciated.  Skin:  Skin is warm, dry and intact. No rash noted. Psychiatric: Mood and affect are normal. Speech and behavior are normal.  ____________________________________________   LABS (all labs ordered are listed, but only abnormal  results are displayed)  Labs Reviewed  COMPREHENSIVE METABOLIC PANEL - Abnormal; Notable for the following components:      Result Value   Potassium 3.0 (*)    Chloride 92 (*)    Glucose, Bld 148 (*)    BUN 22 (*)    Total Protein 9.1 (*)    Albumin 5.2 (*)    AST 47 (*)    ALT 77 (*)    Total Bilirubin 1.5 (*)    Anion gap 17 (*)    All other components within normal limits  CBC - Abnormal; Notable for the following components:   RBC 5.91 (*)    Hemoglobin 17.6 (*)    All other components within normal limits  URINALYSIS, COMPLETE (UACMP) WITH MICROSCOPIC - Abnormal; Notable for the following components:   Color, Urine AMBER (*)    APPearance CLEAR (*)    Protein, ur 30 (*)    All other components within normal limits  LIPASE, BLOOD   ____________________________________________  EKG  None ____________________________________________  RADIOLOGY   ____________________________________________   PROCEDURES  Procedure(s) performed: No  Procedures   Critical Care performed: No ____________________________________________   INITIAL IMPRESSION / ASSESSMENT AND PLAN / ED COURSE  Pertinent labs & imaging results that were available during my care of the patient were reviewed by me and considered in my medical decision making (see chart for details).  Patient overall well-appearing in no acute distress, presentation today is consistent with prior episodes of cyclical vomiting/likely cannabis hyperemesis.  He has had improvement in the past from droperidol, will give 1.25 mg of droperidol, IV fluids and reevaluate.  Lab work is consistent with repetitive vomiting.  He does have potassium prescription at home.  Patient is feeling better and is now tolerating liquids, would like to go home, will discharge with outpatient follow-up, return precautions discussed    ____________________________________________   FINAL CLINICAL IMPRESSION(S) / ED DIAGNOSES  Final  diagnoses:  Cyclical vomiting        Note:  This document was prepared using Dragon voice recognition software and may include unintentional dictation errors.   Jene Every, MD 12/27/19 [phone]

## 2019-12-27 NOTE — ED Triage Notes (Signed)
Pt c/o generalized abd pain with N/V for the past week was seen here for the same 12/26. States he has not gotten any better. denies having diarrhea

## 2021-02-13 ENCOUNTER — Emergency Department (HOSPITAL_COMMUNITY)
Admission: EM | Admit: 2021-02-13 | Discharge: 2021-02-13 | Disposition: A | Discharge status: Home / Self Care | Payer: No Typology Code available for payment source | Attending: Emergency Medicine | Admitting: Emergency Medicine

## 2021-02-13 ENCOUNTER — Encounter (HOSPITAL_COMMUNITY): Payer: Self-pay

## 2021-02-13 ENCOUNTER — Other Ambulatory Visit: Payer: Self-pay

## 2021-02-13 DIAGNOSIS — F1290 Cannabis use, unspecified, uncomplicated: Secondary | ICD-10-CM | POA: Insufficient documentation

## 2021-02-13 DIAGNOSIS — R112 Nausea with vomiting, unspecified: Secondary | ICD-10-CM | POA: Diagnosis not present

## 2021-02-13 LAB — CBC WITH DIFFERENTIAL/PLATELET
Abs Immature Granulocytes: 0.04 10*3/uL (ref 0.00–0.07)
Basophils Absolute: 0 10*3/uL (ref 0.0–0.1)
Basophils Relative: 0 %
Eosinophils Absolute: 0 10*3/uL (ref 0.0–0.5)
Eosinophils Relative: 0 %
HCT: 51.3 % (ref 39.0–52.0)
Hemoglobin: 17.9 g/dL — ABNORMAL HIGH (ref 13.0–17.0)
Immature Granulocytes: 0 %
Lymphocytes Relative: 12 %
Lymphs Abs: 1.4 10*3/uL (ref 0.7–4.0)
MCH: 30.8 pg (ref 26.0–34.0)
MCHC: 34.9 g/dL (ref 30.0–36.0)
MCV: 88.1 fL (ref 80.0–100.0)
Monocytes Absolute: 1.4 10*3/uL — ABNORMAL HIGH (ref 0.1–1.0)
Monocytes Relative: 12 %
Neutro Abs: 8.6 10*3/uL — ABNORMAL HIGH (ref 1.7–7.7)
Neutrophils Relative %: 76 %
Platelets: 285 10*3/uL (ref 150–400)
RBC: 5.82 MIL/uL — ABNORMAL HIGH (ref 4.22–5.81)
RDW: 11.8 % (ref 11.5–15.5)
WBC: 11.5 10*3/uL — ABNORMAL HIGH (ref 4.0–10.5)
nRBC: 0 % (ref 0.0–0.2)

## 2021-02-13 LAB — COMPREHENSIVE METABOLIC PANEL
ALT: 22 U/L (ref 0–44)
AST: 24 U/L (ref 15–41)
Albumin: 5.6 g/dL — ABNORMAL HIGH (ref 3.5–5.0)
Alkaline Phosphatase: 102 U/L (ref 38–126)
Anion gap: 16 — ABNORMAL HIGH (ref 5–15)
BUN: 18 mg/dL (ref 6–20)
CO2: 28 mmol/L (ref 22–32)
Calcium: 10.2 mg/dL (ref 8.9–10.3)
Chloride: 99 mmol/L (ref 98–111)
Creatinine, Ser: 1.11 mg/dL (ref 0.61–1.24)
GFR, Estimated: >60 mL/min (ref >60)
Glucose, Bld: 137 mg/dL — ABNORMAL HIGH (ref 70–99)
Potassium: 3.2 mmol/L — ABNORMAL LOW (ref 3.5–5.1)
Sodium: 143 mmol/L (ref 135–145)
Total Bilirubin: 1.2 mg/dL (ref 0.3–1.2)
Total Protein: 9.3 g/dL — ABNORMAL HIGH (ref 6.5–8.1)

## 2021-02-13 LAB — LIPASE, BLOOD: Lipase: 27 U/L (ref 11–51)

## 2021-02-13 MED ORDER — OMEPRAZOLE 20 MG PO CPDR: 20.0000 mg | DELAYED_RELEASE_CAPSULE | Freq: Every day | ORAL | 0 refills | Status: AC

## 2021-02-13 MED ORDER — DROPERIDOL 2.5 MG/ML IJ SOLN
1.2500 mg | Freq: Once | INTRAMUSCULAR | Status: AC
  Administered 2021-02-13: 1.25 mg via INTRAVENOUS
  Filled 2021-02-13: qty 2

## 2021-02-13 MED ORDER — PROMETHAZINE HCL 25 MG PO TABS: 25.0000 mg | ORAL_TABLET | Freq: Three times a day (TID) | ORAL | 0 refills | Status: DC | PRN

## 2021-02-13 MED ORDER — PROMETHAZINE HCL 12.5 MG PO TABS: 12.5000 mg | ORAL_TABLET | Freq: Three times a day (TID) | ORAL | 0 refills | Status: AC | PRN

## 2021-02-13 MED ORDER — PANTOPRAZOLE SODIUM 40 MG IV SOLR
40.0000 mg | Freq: Once | INTRAVENOUS | Status: AC
  Administered 2021-02-13: 40 mg via INTRAVENOUS
  Filled 2021-02-13: qty 40

## 2021-02-13 MED ORDER — SODIUM CHLORIDE 0.9 % IV BOLUS
1000.0000 mL | Freq: Once | INTRAVENOUS | Status: AC
  Administered 2021-02-13: 1000 mL via INTRAVENOUS

## 2021-02-13 NOTE — ED Triage Notes (Signed)
Pt presents with c/o vomiting for 5 days. Pt reports he has cannibinoid hyperemesis, last use of marijuana was 8 days ago. Pt is concerned that he is dehydrated.

## 2021-02-13 NOTE — ED Provider Notes (Signed)
[facility] DEPT Provider Note   CSN: [id number] Arrival date & time: 02/13/21  1222     History Chief Complaint  Patient presents with  . Vomiting    Peter Suarez is a 30 y.o. male.  HPI   Patient with significant medical history of anxiety, cannabinoid hyperemesis syndrome GERD presents with chief complaint of uncontrolled nausea and vomiting.  He endorses that he smoked marijuana approximately 8 days ago and has been unable to hold food or water down since then.  He denies any other symptoms at this time, denies abdominal pain, constipation, diarrhea, urinary symptoms, has no significant abdominal history, no history of stomach ulcers, alcohol use, nicotine use, no history of pancreatitis, gallbladder normalities or bowel obstructions.  It is typical for him to be unable to hold p.o. down for 7 days after he smokes marijuana, states he has to come here for IV fluids and medications.  Patient denies headaches, fevers, chills, shortness of breath, chest pain, abdominal pain, diarrhea and pedal edema.  Past Medical History:  Diagnosis Date  . Anxiety   . Cannabinoid hyperemesis syndrome   . GERD (gastroesophageal reflux disease)   . Nausea and vomiting    chronic, recurrent    Patient Active Problem List   Diagnosis Date Noted  . Protein-calorie malnutrition, severe ([place]) 01/12/2015  . Intractable nausea and vomiting 01/11/2015  . Erosive gastritis 01/11/2015  . GERD (gastroesophageal reflux disease) 01/11/2015  . Nausea & vomiting 01/11/2015  . Nausea with vomiting 10/30/2014  . Loss of weight 10/30/2014    Past Surgical History:  Procedure Laterality Date  . ESOPHAGOGASTRODUODENOSCOPY  10/31/2014       Family History  Problem Relation Age of Onset  . High blood pressure Maternal Grandmother     Social History   Tobacco Use  . Smoking status: Never Smoker  . Smokeless tobacco: Never Used  Vaping Use  . Vaping Use: Never used   Substance Use Topics  . Alcohol use: No    Alcohol/week: 0.0 standard drinks    Comment: Rarely  . Drug use: Yes    Frequency: 7.0 times per week    Types: Marijuana    Home Medications Prior to Admission medications   Medication Sig Start Date End Date Taking? Authorizing Provider  omeprazole (PRILOSEC) 20 MG capsule Take 1 capsule (20 mg total) by mouth daily. 02/13/21  Yes Marcello Fennel, PA-C  promethazine (PHENERGAN) 25 MG tablet Take 1 tablet (25 mg total) by mouth every 8 (eight) hours as needed for up to 12 doses for nausea or vomiting. 02/13/21  Yes Marcello Fennel, PA-C  ondansetron (ZOFRAN-ODT) 4 MG disintegrating tablet Take 1 tablet (4 mg total) by mouth every 8 (eight) hours as needed for nausea or vomiting. 12/23/19   Triplett, Johnette Abraham B, FNP  potassium chloride (K-DUR) 10 MEQ tablet Take 1 tablet (10 mEq total) by mouth daily. 06/25/19   Delman Kitten, MD  promethazine (PHENERGAN) 25 MG tablet Take 1 tablet (25 mg total) by mouth every 8 (eight) hours as needed for nausea or vomiting. 11/20/19   Duffy Bruce, MD    Allergies    Lorazepam  Review of Systems   Review of Systems  Constitutional: Negative for chills and fever.  HENT: Negative for congestion and sore throat.   Respiratory: Negative for shortness of breath.   Cardiovascular: Negative for chest pain.  Gastrointestinal: Positive for nausea and vomiting. Negative for abdominal pain, constipation and diarrhea.  Genitourinary: Negative  for decreased urine volume, difficulty urinating and enuresis.  Musculoskeletal: Negative for back pain.  Skin: Negative for rash.  Neurological: Negative for dizziness and headaches.  Hematological: Does not bruise/bleed easily.    Physical Exam Updated Vital Signs BP 124/81   Pulse 73   Temp 98 F (36.7 C) (Oral)   Resp 19   Ht $R'6\' 2"'cR$  (1.88 m)   Wt 95.3 kg   SpO2 94%   BMI 26.96 kg/m   Physical Exam Vitals and nursing note reviewed.  Constitutional:       General: He is not in acute distress.    Appearance: He is not ill-appearing.  HENT:     Head: Normocephalic and atraumatic.     Nose: No congestion.     Mouth/Throat:     Mouth: Mucous membranes are dry.     Pharynx: Oropharynx is clear. No oropharyngeal exudate or posterior oropharyngeal erythema.  Eyes:     Conjunctiva/sclera: Conjunctivae normal.  Cardiovascular:     Rate and Rhythm: Normal rate and regular rhythm.     Pulses: Normal pulses.     Heart sounds: No murmur heard. No friction rub. No gallop.   Pulmonary:     [place]: No respiratory distress.     Breath sounds: No wheezing, rhonchi or rales.  Abdominal:     Palpations: Abdomen is soft.     Tenderness: There is abdominal tenderness. There is no right CVA tenderness or left CVA tenderness.     Comments: Patient abdomen is nondistended, normoactive bowel sounds, dull to percussion.  He is tender to palpation in his epigastric and left upper quadrant, no Murphy sign or McBurney point, no peritoneal sign present.  Negative CVA tenderness.  Musculoskeletal:     Right lower leg: No edema.     Left lower leg: No edema.     Comments: Patient is moving all 4 extremities at difficulty.  Skin:    General: Skin is warm and dry.  Neurological:     Mental Status: He is alert.  Psychiatric:        Mood and Affect: Mood normal.     ED Results / Procedures / Treatments   Labs (all labs ordered are listed, but only abnormal results are displayed) Labs Reviewed  COMPREHENSIVE METABOLIC PANEL - Abnormal; Notable for the following components:      Result Value   Potassium 3.2 (*)    Glucose, Bld 137 (*)    Total Protein 9.3 (*)    Albumin 5.6 (*)    Anion gap 16 (*)    All other components within normal limits  CBC WITH DIFFERENTIAL/PLATELET - Abnormal; Notable for the following components:   WBC 11.5 (*)    RBC 5.82 (*)    Hemoglobin 17.9 (*)    Neutro Abs 8.6 (*)    Monocytes Absolute 1.4 (*)    All other components  within normal limits  LIPASE, BLOOD    EKG None  Radiology No results found.  Procedures Procedures   Medications Ordered in ED Medications  droperidol (INAPSINE) 2.5 MG/ML injection 1.25 mg (1.25 mg Intravenous Given 02/13/21 1402)  pantoprazole (PROTONIX) injection 40 mg (40 mg Intravenous Given 02/13/21 1401)  sodium chloride 0.9 % bolus 1,000 mL (1,000 mLs Intravenous Bolus 02/13/21 1519)    ED Course  I have reviewed the triage vital signs and the nursing notes.  Pertinent labs & imaging results that were available during my care of the patient were reviewed by me and  considered in my medical decision making (see chart for details).    MDM Rules/Calculators/A&P                          Initial impression-patient presents with hyperemesis syndrome he is alert, does not appear in acute distress, vital signs reassuring.  Will obtain basic lab work, EKG.  Will start patient on droperidol as seems in the past has helped some.  Work-up-CBC shows slight leukocytosis 11.5, increased red blood cells and hemoglobin.  CMP shows slight hypokalemia 3.2, slight hyperglycemia 137, anion gap of 16.  Lipase 27.  EKG sinus without signs of ischemia no ST elevation or depression noted.  Reassessment patient was reassessed after providing him with a liter of fluid as well as droperidol, states he is feeling much better, has not had any more episodes of vomiting or nausea.  He is tolerating ice chips at this time.  Patient is updated on lab work and he is agreeable to discharge.  Rule out-I have low suspicion for perforated stomach ulcer as abdomen is nondistended, dull to percussion, no peritoneal signs on my exam.  Low suspicion for gallbladder abnormality as patient has no right upper quadrant pain, no elevation liver enzymes or alk phos.  Low suspicion for pancreatitis as patient is low risk factors, lipase is 27.  Suspicion for AAA as history is atypical, patient has low risk factors.  Low  suspicion for appendicitis or diverticulitis as there is no elevation in white count, no tenderness in the left lower or right lower quadrants.  Plan-I suspect patient is suffering from cannabis induced hyperemesis may have secondary gastritis.  Will provide patient with antiemetics and place him on a PPI.  We will have him follow-up with PCP for further evaluation.  Vital signs have remained stable, no indication for hospital admission.  Patient given at home care as well strict return precautions.  Patient verbalized that they understood agreed to said plan.   Final Clinical Impression(s) / ED Diagnoses Final diagnoses:  Non-intractable vomiting with nausea, unspecified vomiting type    Rx / DC Orders ED Discharge Orders         Ordered    promethazine (PHENERGAN) 25 MG tablet  Every 8 hours PRN        02/13/21 1609    omeprazole (PRILOSEC) 20 MG capsule  Daily        02/13/21 1609           Aron Baba 02/13/21 1641    Drenda Freeze, MD 02/13/21 2219

## 2021-02-13 NOTE — Discharge Instructions (Addendum)
Even seen here for nausea and vomiting.  I suspect this is from cannabis use and possible gastritis.  I have asked given you prescription for Phenergan please take as needed for nausea.  Also given you prescription for an acid pill please take as prescribed.   Like to follow-up with your primary care provider in 1 week time for reevaluation.  Come back to the emergency department if you develop chest pain, shortness of breath, severe abdominal pain, uncontrolled nausea, vomiting, diarrhea.
